# Patient Record
Sex: Female | Born: 2000 | Race: White | Hispanic: No | Marital: Single | Smoking: Never smoker
Health system: Southern US, Community
[De-identification: ages and names within clinical notes are randomized; demographics above are authoritative.]

## PROBLEM LIST (undated history)

## (undated) DIAGNOSIS — F32A Depression, unspecified: Secondary | ICD-10-CM

## (undated) DIAGNOSIS — F419 Anxiety disorder, unspecified: Secondary | ICD-10-CM

## (undated) DIAGNOSIS — E669 Obesity, unspecified: Secondary | ICD-10-CM

## (undated) DIAGNOSIS — J45909 Unspecified asthma, uncomplicated: Secondary | ICD-10-CM

## (undated) HISTORY — PX: WISDOM TOOTH EXTRACTION: SHX21

## (undated) HISTORY — PX: TONSILLECTOMY AND ADENOIDECTOMY: SUR1326

---
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
## 2021-01-08 ENCOUNTER — Ambulatory Visit: Payer: Self-pay | Admitting: Adult Health

## 2021-02-13 ENCOUNTER — Ambulatory Visit
Admission: EM | Admit: 2021-02-13 | Discharge: 2021-02-13 | Disposition: A | Discharge status: Home / Self Care | Payer: Self-pay | Attending: Family Medicine | Admitting: Family Medicine

## 2021-02-13 ENCOUNTER — Other Ambulatory Visit: Payer: Self-pay

## 2021-02-13 ENCOUNTER — Ambulatory Visit: Payer: Self-pay

## 2021-02-13 DIAGNOSIS — J069 Acute upper respiratory infection, unspecified: Secondary | ICD-10-CM

## 2021-02-13 DIAGNOSIS — K120 Recurrent oral aphthae: Secondary | ICD-10-CM

## 2021-02-13 MED ORDER — PREDNISONE 20 MG PO TABS: 40.0000 mg | ORAL_TABLET | Freq: Every day | ORAL | 0 refills | Status: DC

## 2021-02-13 NOTE — ED Triage Notes (Signed)
Patient presents to Urgent Care with complaints of sore throat and nasal congestion since Friday. She states she had a fever on sat/sun but since no fever. Treating symptoms with dayquil and nyquil.

## 2021-02-13 NOTE — Progress Notes (Signed)
Text sent to start video visit at approximately 1:15 pm.   Call placed to patient at approximately 1:20 pm, left message to return call or sign onto video visit to start visit.  At 1:30 pm, patient had not joined video visit; visit cancelled.

## 2021-02-14 ENCOUNTER — Telehealth (INDEPENDENT_AMBULATORY_CARE_PROVIDER_SITE_OTHER): Payer: Self-pay | Admitting: Nurse Practitioner

## 2021-02-14 DIAGNOSIS — Z91199 Patient's noncompliance with other medical treatment and regimen due to unspecified reason: Secondary | ICD-10-CM

## 2021-02-14 DIAGNOSIS — Z5329 Procedure and treatment not carried out because of patient's decision for other reasons: Secondary | ICD-10-CM

## 2021-02-16 NOTE — ED Provider Notes (Signed)
[facility]   [id number] 02/13/21 Arrival Time: 1351  ASSESSMENT & PLAN:  1. Viral URI with cough   2. Oral aphthous ulcer    Discussed typical duration of viral illnesses. OTC symptom care as needed.  Begin trial of: Meds ordered this encounter  Medications   predniSONE (DELTASONE) 20 MG tablet    Sig: Take 2 tablets (40 mg total) by mouth daily.    Dispense:  10 tablet    Refill:  0  Oragel if needed.   Follow-up Information     Valentino Nose, NP.   Specialty: Nurse Practitioner Why: If worsening or failing to improve as anticipated. Contact information: [address] [place] [address] [phone]                 Reviewed expectations re: course of current medical issues. Questions answered. Outlined signs and symptoms indicating need for more acute intervention. Understanding verbalized. After Visit Summary given.   SUBJECTIVE: History from: patient. Carolyn [name] is a 20 y.o. female who reports ST and nasal congestion; sev days; abrupt onset. Recent travel: none. Denies: fever and difficulty breathing. Normal PO intake without n/v/d.   OBJECTIVE:  Vitals:   02/13/21 1404 02/13/21 1406  BP: 128/89   Pulse: 84   Resp: 18   Temp:  98.3 F (36.8 C)  TempSrc: Oral Oral  SpO2: 96%     General appearance: alert; no distress Eyes: PERRLA; EOMI; conjunctiva normal HENT: [place]; AT; with nasal congestion; few small aphthous ulcers Neck: supple without LAD Lungs: speaks full sentences without difficulty; unlabored Extremities: no edema Skin: warm and dry Neurologic: normal gait Psychological: alert and cooperative; normal mood and affect  No Known Allergies  Past Medical History:  Diagnosis Date   Anxiety    Asthma    Depression    Obesity    Social History   Socioeconomic History   Marital status: Single    Spouse name: Not on file   Number of children: 0   Years of education: Not on file   Highest education  level: 10th grade  Occupational History   Not on file  Tobacco Use   Smoking status: Never   Smokeless tobacco: Never  Vaping Use   Vaping Use: Former  Substance and Sexual Activity   Alcohol use: Never   Drug use: Never   Sexual activity: Yes    Birth control/protection: None, Condom  Other Topics Concern   Not on file  Social History Narrative   Not on file   Social Determinants of Health   Financial Resource Strain: Not on file  Food Insecurity: Not on file  Transportation Needs: Not on file  Physical Activity: Not on file  Stress: Not on file  Social Connections: Not on file  Intimate Partner Violence: Not on file   Family History  Problem Relation Age of Onset   Cancer Paternal Grandfather    Diabetes Paternal Grandmother    Hypertension Paternal Grandmother    Hernia Paternal Grandmother    Other Paternal Grandmother        blood clots   Heart attack Paternal Grandmother    Stroke Paternal Grandmother    Hypertension Maternal Grandmother    Hyperthyroidism Maternal Grandmother    Cancer Maternal Grandmother    Anxiety disorder Maternal Grandmother    Diabetes Maternal Grandfather    Hypertension Maternal Grandfather    Congestive Heart Failure Maternal Grandfather    Stroke Maternal Grandfather  Congestive Heart Failure Father    Hypertension Father    Gout Father    Diabetes Father    Anxiety disorder Mother    Hypertension Sister    Gestational diabetes Sister    Anxiety disorder Maternal Aunt    Past Surgical History:  Procedure Laterality Date   CYSTOSCOPY WITH STENT PLACEMENT Left 04/02/2020   Procedure: CYSTOSCOPY WITH STENT PLACEMENT;  Surgeon: Jerilee Field, MD;  Location: [facility] OR;  Service: Urology;  Laterality: Left;   CYSTOSCOPY/URETEROSCOPY/HOLMIUM LASER/STENT PLACEMENT Left 04/17/2020   Procedure: CYSTOSCOPY/URETEROSCOPY/HOLMIUM LASER/STENT EXCHANGE;  Surgeon: Jerilee Field, MD;  Location: [facility] ORS;  Service: Urology;  Laterality:  Left;  ONLY NEEDS 60 MIN   TONSILLECTOMY AND ADENOIDECTOMY     WISDOM TOOTH EXTRACTION       Mardella Layman, MD 02/16/21 1047

## 2021-03-20 ENCOUNTER — Encounter (HOSPITAL_BASED_OUTPATIENT_CLINIC_OR_DEPARTMENT_OTHER): Payer: Self-pay | Admitting: Obstetrics and Gynecology

## 2021-03-20 ENCOUNTER — Emergency Department (HOSPITAL_BASED_OUTPATIENT_CLINIC_OR_DEPARTMENT_OTHER): Payer: Self-pay | Admitting: Radiology

## 2021-03-20 ENCOUNTER — Other Ambulatory Visit: Payer: Self-pay

## 2021-03-20 ENCOUNTER — Emergency Department (HOSPITAL_BASED_OUTPATIENT_CLINIC_OR_DEPARTMENT_OTHER)
Admission: EM | Admit: 2021-03-20 | Discharge: 2021-03-20 | Disposition: A | Discharge status: Home / Self Care | Payer: Self-pay | Attending: Emergency Medicine | Admitting: Emergency Medicine

## 2021-03-20 DIAGNOSIS — Y92009 Unspecified place in unspecified non-institutional (private) residence as the place of occurrence of the external cause: Secondary | ICD-10-CM | POA: Insufficient documentation

## 2021-03-20 DIAGNOSIS — X501XXA Overexertion from prolonged static or awkward postures, initial encounter: Secondary | ICD-10-CM | POA: Insufficient documentation

## 2021-03-20 DIAGNOSIS — S93401A Sprain of unspecified ligament of right ankle, initial encounter: Secondary | ICD-10-CM | POA: Insufficient documentation

## 2021-03-20 DIAGNOSIS — J4520 Mild intermittent asthma, uncomplicated: Secondary | ICD-10-CM | POA: Insufficient documentation

## 2021-03-20 NOTE — ED Provider Notes (Signed)
[facility] EMERGENCY DEPT Provider Note   CSN: [id number] Arrival date & time: 03/20/21  1200     History Chief Complaint  Patient presents with   Fall   Ankle Pain    Carolyn [name] is a 20 y.o. female.  20 year old female presents with complaint of right ankle pain.  Patient states that she rolled her ankle last night and then again today resulting in pain in her lateral ankle.  States that she may have sprained the ankle in the past however no significant injury otherwise.  Patient is able to bear weight with pain.  No other complaints or concerns.      Past Medical History:  Diagnosis Date   Anxiety    Asthma    Depression    Obesity     Patient Active Problem List   Diagnosis Date Noted   BMI 60.0-69.9, adult (HCC) 07/31/2020   Elevated blood-pressure reading without diagnosis of hypertension 07/31/2020   Recurrent major depressive disorder, in partial remission (HCC) 07/31/2020   Other specified anxiety disorders 07/31/2020   Left breast mass 05/22/2020   Missed period 05/22/2020   Encounter for initial prescription of injectable contraceptive 05/22/2020   Pyelonephritis of left kidney 04/02/2020   Left ureteral stone 04/02/2020   Microcytic anemia 04/02/2020   Sepsis secondary to UTI (HCC) 04/02/2020   Pregnancy examination or test, negative result 05/20/2019   Mild intermittent asthma without complication 02/15/2019   Seasonal allergies 02/15/2019   Encounter for initial prescription of contraceptive pills 12/29/2017   Superficial fungus infection of skin 12/29/2017   Class 3 severe obesity due to excess calories without serious comorbidity with body mass index (BMI) of 60.0 to 69.9 in adult (HCC) 12/29/2017   Irregular periods 12/29/2017   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   Quadriceps weakness 06/27/2015    Past Surgical  History:  Procedure Laterality Date   CYSTOSCOPY WITH STENT PLACEMENT Left 04/02/2020   Procedure: CYSTOSCOPY WITH STENT PLACEMENT;  Surgeon: Jerilee Field, MD;  Location: [facility] OR;  Service: Urology;  Laterality: Left;   CYSTOSCOPY/URETEROSCOPY/HOLMIUM LASER/STENT PLACEMENT Left 04/17/2020   Procedure: CYSTOSCOPY/URETEROSCOPY/HOLMIUM LASER/STENT EXCHANGE;  Surgeon: Jerilee Field, MD;  Location: [facility] ORS;  Service: Urology;  Laterality: Left;  ONLY NEEDS 60 MIN   TONSILLECTOMY AND ADENOIDECTOMY     WISDOM TOOTH EXTRACTION       OB History     Gravida  0   Para  0   Term  0   Preterm  0   AB  0   Living  0      SAB  0   IAB  0   Ectopic  0   Multiple  0   Live Births  0           Family History  Problem Relation Age of Onset   Cancer Paternal Grandfather    Diabetes Paternal Grandmother    Hypertension Paternal Grandmother    Hernia Paternal Grandmother    Other Paternal Grandmother        blood clots   Heart attack Paternal Grandmother    Stroke Paternal Grandmother    Hypertension Maternal Grandmother    Hyperthyroidism Maternal Grandmother    Cancer Maternal Grandmother    Anxiety disorder Maternal Grandmother    Diabetes Maternal Grandfather    Hypertension Maternal Grandfather    Congestive Heart Failure Maternal Grandfather  Stroke Maternal Grandfather    Congestive Heart Failure Father    Hypertension Father    Gout Father    Diabetes Father    Anxiety disorder Mother    Hypertension Sister    Gestational diabetes Sister    Anxiety disorder Maternal Aunt     Social History   Tobacco Use   Smoking status: Never   Smokeless tobacco: Never  Vaping Use   Vaping Use: Former  Substance Use Topics   Alcohol use: Never   Drug use: Never    Home Medications Prior to Admission medications   Medication Sig Start Date End Date Taking? Authorizing Provider  escitalopram (LEXAPRO) 20 MG tablet Take 1 tablet (20 mg total) by mouth daily.  08/08/20   Darcel Smalling, MD  hydrOXYzine (ATARAX/VISTARIL) 10 MG tablet Take 10 mg by mouth at bedtime as needed.    [provider]  ibuprofen (ADVIL) 400 MG tablet Take 1 tablet (400 mg total) by mouth every 6 (six) hours as needed for fever or mild pain (Use if tylenol ineffective). 04/04/20   Cipriano Bunker, MD  nystatin (MYCOSTATIN/NYSTOP) powder Apply 1 application topically 3 (three) times daily.    [provider]  predniSONE (DELTASONE) 20 MG tablet Take 2 tablets (40 mg total) by mouth daily. Patient not taking: Reported on 03/20/2021 02/13/21   Mardella Layman, MD    Allergies    Patient has no known allergies.  Review of Systems   Review of Systems  Constitutional:  Negative for fever.  Musculoskeletal:  Positive for arthralgias, gait problem and joint swelling.  Skin:  Negative for color change, rash and wound.  Allergic/Immunologic: Negative for immunocompromised state.  Neurological:  Negative for weakness and numbness.  Hematological:  Does not bruise/bleed easily.  Psychiatric/Behavioral:  Negative for confusion.   All other systems reviewed and are negative.  Physical Exam Updated Vital Signs BP (!) 125/95 (BP Location: Right Arm)   Pulse 83   Temp 98.1 F (36.7 C)   Resp 16   Ht 5\' 3"  (1.6 m)   Wt (!) 158.8 kg   LMP 03/11/2021 (Exact Date)   SpO2 100%   BMI 62.00 kg/m   Physical Exam Vitals and nursing note reviewed.  Constitutional:      General: She is not in acute distress.    Appearance: She is well-developed. She is not diaphoretic.  HENT:     Head: Normocephalic and atraumatic.  Cardiovascular:     Pulses: Normal pulses.  Pulmonary:     Effort: Pulmonary effort is normal.  Musculoskeletal:        General: Swelling, tenderness and signs of injury present. No deformity.     Right ankle: Swelling present. No ecchymosis or lacerations. Tenderness present over the lateral malleolus. No base of 5th metatarsal or proximal fibula  tenderness. Normal range of motion. Normal pulse.     Left ankle: Normal.  Skin:    General: Skin is warm and dry.     Findings: No erythema or rash.  Neurological:     Mental Status: She is alert and oriented to person, place, and time.     Sensory: No sensory deficit.     Motor: No weakness.  Psychiatric:        Behavior: Behavior normal.    ED Results / Procedures / Treatments   Labs (all labs ordered are listed, but only abnormal results are displayed) Labs Reviewed - No data to display  EKG None  Radiology DG  Ankle Complete Right  Result Date: 03/20/2021 CLINICAL DATA:  Right ankle pain and swelling after injury 1 day ago EXAM: RIGHT ANKLE - COMPLETE 3+ VIEW COMPARISON:  01/24/2016 FINDINGS: There is no evidence of fracture, dislocation, or joint effusion. There is no evidence of arthropathy or other focal bone abnormality. Os trigonum versus prominent stieda process. Soft tissue swelling most pronounced at the lateral ankle. IMPRESSION: 1. No acute fracture or dislocation. 2. Soft tissue swelling most pronounced at the lateral ankle. Electronically Signed   By: Duanne Guess D.O.   On: 03/20/2021 13:02    Procedures Procedures   Medications Ordered in ED Medications - No data to display  ED Course  I have reviewed the triage vital signs and the nursing notes.  Pertinent labs & imaging results that were available during my care of the patient were reviewed by me and considered in my medical decision making (see chart for details).  Clinical Course as of 03/20/21 1351  Wed Mar 20, 2021  6958 20 year old female with right ankle injury as above.  X-ray negative for fracture.  Found to have tenderness to lateral malleolus and anterior to the lateral malleolus.  No pain at the proximal fibula or fifth metatarsal.  Pulse intact, sensation intact.  Plan is to treat as sprain with ankle ASO.  Offered crutches to weight-bear as tolerated recommend recheck with PCP in 1 week if  not improving. [LM]    Clinical Course User Index [LM] Alden Hipp   MDM Rules/Calculators/A&P                            Final Clinical Impression(s) / ED Diagnoses Final diagnoses:  Sprain of right ankle, unspecified ligament, initial encounter    Rx / DC Orders ED Discharge Orders     None        Jeannie Fend, PA-C 03/20/21 1351    Alvira Monday, MD 03/23/21 [phone]

## 2021-03-20 NOTE — ED Notes (Signed)
Pt stated that she has crutches at home. She refused the crutches.

## 2021-03-20 NOTE — Discharge Instructions (Addendum)
Motrin and Tylenol as needed as directed for pain.  Apply ice to your ankle and elevate for 20 minutes at a time.  Use crutches, weight-bear as tolerated. Recheck with your primary care provider if not improving in 1 week.

## 2021-03-20 NOTE — ED Triage Notes (Signed)
Patient reports a fall and right ankle pain. Patient endorses that she was at a haunted house last night and rolled it then hurt it again this morning stepping off her porch. Denies hitting head or LOC

## 2021-03-26 ENCOUNTER — Other Ambulatory Visit: Payer: Self-pay

## 2021-03-26 ENCOUNTER — Encounter: Payer: Self-pay | Admitting: Adult Health

## 2021-03-26 ENCOUNTER — Ambulatory Visit: Payer: Self-pay | Admitting: Adult Health

## 2021-03-29 ENCOUNTER — Ambulatory Visit (INDEPENDENT_AMBULATORY_CARE_PROVIDER_SITE_OTHER): Payer: Self-pay | Admitting: Adult Health

## 2021-03-29 ENCOUNTER — Encounter: Payer: Self-pay | Admitting: Adult Health

## 2021-03-29 ENCOUNTER — Other Ambulatory Visit (HOSPITAL_COMMUNITY)
Admission: RE | Admit: 2021-03-29 | Discharge: 2021-03-29 | Disposition: A | Discharge status: Home / Self Care | Payer: Self-pay | Source: Ambulatory Visit | Attending: Adult Health | Admitting: Adult Health

## 2021-03-29 ENCOUNTER — Other Ambulatory Visit: Payer: Self-pay

## 2021-03-29 VITALS — BP 128/83 | HR 82 | Ht 62.0 in | Wt 368.0 lb

## 2021-03-29 DIAGNOSIS — R5383 Other fatigue: Secondary | ICD-10-CM

## 2021-03-29 DIAGNOSIS — Z113 Encounter for screening for infections with a predominantly sexual mode of transmission: Secondary | ICD-10-CM | POA: Insufficient documentation

## 2021-03-29 DIAGNOSIS — N926 Irregular menstruation, unspecified: Secondary | ICD-10-CM

## 2021-03-29 DIAGNOSIS — N946 Dysmenorrhea, unspecified: Secondary | ICD-10-CM

## 2021-03-29 DIAGNOSIS — N921 Excessive and frequent menstruation with irregular cycle: Secondary | ICD-10-CM

## 2021-03-29 MED ORDER — LO LOESTRIN FE 1 MG-10 MCG / 10 MCG PO TABS: 1.0000 | ORAL_TABLET | Freq: Every day | ORAL | 0 refills | Status: DC

## 2021-03-29 NOTE — Progress Notes (Signed)
  Subjective:     Patient ID: Carolyn [name], female   DOB: 05/16/2001, 20 y.o.   MRN: [id number]  HPI Carolyn [name] is a 20 year old white female,single, G0P0, in complaining of irregular periods and heavy periods, may last 3-14 days and heavy about 5, will change pads every 1-2 hours and has bad pain with periods, and pain with sex and even BMs. And feels tired, wonders if endometriosis or PCO, has CT in March without mention of cysts on ovaries. PCP is Cathlean Marseilles NP.   Review of Systems +irregular periods +heavy periods +painful periods +pain with sex and BM +tired  Reviewed past medical,surgical, social and family history. Reviewed medications and allergies.     Objective:   Physical Exam BP 128/83 (BP Location: Left Arm, Patient Position: Sitting, Cuff Size: Large)   Pulse 82   Ht 5\' 2"  (1.575 m)   Wt (!) 368 lb (166.9 kg)   LMP 03/11/2021 (Exact Date)   BMI 67.31 kg/m  Skin warm and dry.Pelvic: external genitalia is normal in appearance no lesions, vagina: scant discharge with odor,urethra has no lesions or masses noted, cervix:smooth, uterus: normal size, shape and contour, non tender, no masses felt, adnexa: no masses + tenderness noted,R>L.  Bladder is non tender and no masses felt. CV swab obtained.   Examination chaperoned by 05/11/2021 Fall risk is low  Upstream - 03/29/21 1239       Pregnancy Intention Screening   Does the patient want to become pregnant in the next year? Ok Either Way    Does the patient's partner want to become pregnant in the next year? Ok Either Way    Would the patient like to discuss contraceptive options today? No      Contraception Wrap Up   Current Method Female Condom    End Method Female Condom    Contraception Counseling Provided No             Assessment:       1. Irregular periods Will try lo loestrin, to start today, 3 packs given and use condoms   2. Menorrhagia with irregular cycle   3. Dysmenorrhea   4. Other  fatigue   5. Screening examination for STD (sexually transmitted disease) CV swab with GC/CHL,trich and BV and yeast   6. Morbid obesity (HCC) Try to lose some weight Plan:     Follow up in 8 weeks, if not better will get labs and possible 03/31/21

## 2021-04-02 LAB — CERVICOVAGINAL ANCILLARY ONLY
Bacterial Vaginitis (gardnerella): NEGATIVE
Candida Glabrata: NEGATIVE
Candida Vaginitis: NEGATIVE
Chlamydia: NEGATIVE
Comment: NEGATIVE
Comment: NEGATIVE
Comment: NEGATIVE
Comment: NEGATIVE
Comment: NEGATIVE
Comment: NORMAL
Neisseria Gonorrhea: NEGATIVE
Trichomonas: NEGATIVE

## 2021-04-09 ENCOUNTER — Encounter (HOSPITAL_BASED_OUTPATIENT_CLINIC_OR_DEPARTMENT_OTHER): Payer: Self-pay | Admitting: *Deleted

## 2021-04-09 ENCOUNTER — Other Ambulatory Visit: Payer: Self-pay

## 2021-04-09 ENCOUNTER — Other Ambulatory Visit: Payer: Self-pay | Admitting: Adult Health

## 2021-04-09 DIAGNOSIS — N939 Abnormal uterine and vaginal bleeding, unspecified: Secondary | ICD-10-CM | POA: Insufficient documentation

## 2021-04-09 DIAGNOSIS — J4520 Mild intermittent asthma, uncomplicated: Secondary | ICD-10-CM | POA: Insufficient documentation

## 2021-04-09 LAB — CBC
HCT: 35.5 % — ABNORMAL LOW (ref 36.0–46.0)
Hemoglobin: 10.4 g/dL — ABNORMAL LOW (ref 12.0–15.0)
MCH: 19.8 pg — ABNORMAL LOW (ref 26.0–34.0)
MCHC: 29.3 g/dL — ABNORMAL LOW (ref 30.0–36.0)
MCV: 67.7 fL — ABNORMAL LOW (ref 80.0–100.0)
Platelets: 501 10*3/uL — ABNORMAL HIGH (ref 150–400)
RBC: 5.24 MIL/uL — ABNORMAL HIGH (ref 3.87–5.11)
RDW: 18.2 % — ABNORMAL HIGH (ref 11.5–15.5)
WBC: 14.1 10*3/uL — ABNORMAL HIGH (ref 4.0–10.5)
nRBC: 0 % (ref 0.0–0.2)

## 2021-04-09 LAB — PREGNANCY, URINE: Preg Test, Ur: NEGATIVE

## 2021-04-09 NOTE — ED Triage Notes (Signed)
Reports heavy vaginal bleeding since Saturday night "woke up in a pool of blood". States she has been going through 2 pads + ultra tampon every hour. Spoke to her gyn and was advised to seen seen in ED. She started on new birth control pill ~1 week ago

## 2021-04-10 ENCOUNTER — Emergency Department (HOSPITAL_BASED_OUTPATIENT_CLINIC_OR_DEPARTMENT_OTHER)
Admission: EM | Admit: 2021-04-10 | Discharge: 2021-04-10 | Disposition: A | Discharge status: Home / Self Care | Payer: Self-pay | Attending: Emergency Medicine | Admitting: Emergency Medicine

## 2021-04-10 DIAGNOSIS — N939 Abnormal uterine and vaginal bleeding, unspecified: Secondary | ICD-10-CM

## 2021-04-10 LAB — URINALYSIS, ROUTINE W REFLEX MICROSCOPIC
Bilirubin Urine: NEGATIVE
Glucose, UA: NEGATIVE mg/dL
Ketones, ur: NEGATIVE mg/dL
Nitrite: NEGATIVE
Protein, ur: 30 mg/dL — AB
RBC / HPF: >50 RBC/hpf — ABNORMAL HIGH (ref 0–5)
Specific Gravity, Urine: 1.028 (ref 1.005–1.030)
pH: 6 (ref 5.0–8.0)

## 2021-04-10 LAB — HEMOGLOBIN AND HEMATOCRIT, BLOOD
HCT: 34.1 % — ABNORMAL LOW (ref 36.0–46.0)
Hemoglobin: 9.9 g/dL — ABNORMAL LOW (ref 12.0–15.0)

## 2021-04-10 MED ORDER — MEDROXYPROGESTERONE ACETATE 5 MG PO TABS
10.0000 mg | ORAL_TABLET | Freq: Every day | ORAL | 0 refills | Status: DC
Start: 2021-04-10 — End: 2021-04-10

## 2021-04-10 MED ORDER — MEDROXYPROGESTERONE ACETATE 5 MG PO TABS: 10.0000 mg | ORAL_TABLET | Freq: Every day | ORAL | 0 refills | Status: DC

## 2021-04-10 NOTE — ED Provider Notes (Signed)
[facility]ENC[facility] DEPT Provider Note   CSN: [id number] Arrival date & time: 04/09/21  2046     History Chief Complaint  Patient presents with   Vaginal Bleeding    Carolyn [name] is a 20 y.o. female.  Patient is a 20 year old female with history of asthma, obesity, and suspected polycystic ovaries.  Patient presenting today for evaluation of vaginal bleeding.  She reports being started on a new oral contraceptive 2 weeks ago.  She started 3 evenings ago with vaginal bleeding.  She states that she has soiled a pad per hour since.  She attempted to call the GYN and received a message to come to the ER to be evaluated.  She denies to me she is having any abdominal pain, but does report some cramping when she passes clots.  She denies any fevers or chills.  She reports having had pelvic examination 2 weeks ago at the GYN office.  STD testing was negative and was otherwise unremarkable.  The history is provided by the patient.  Vaginal Bleeding Quality:  Bright red Severity:  Moderate Onset quality:  Gradual Duration:  3 days Timing:  Constant Progression:  Unchanged Chronicity:  New     Past Medical History:  Diagnosis Date   Anxiety    Asthma    Depression    Obesity     Patient Active Problem List   Diagnosis Date Noted   Other fatigue 03/29/2021   Dysmenorrhea 03/29/2021   Menorrhagia with irregular cycle 03/29/2021   Screening examination for STD (sexually transmitted disease) 03/29/2021   Morbid obesity ([place]) 03/29/2021   BMI 60.0-69.9, adult ([place]) 07/31/2020   Elevated blood-pressure reading without diagnosis of hypertension 07/31/2020   Recurrent major depressive disorder, in partial remission ([place]) 07/31/2020   Other specified anxiety disorders 07/31/2020   Left breast mass 05/22/2020   Missed period 05/22/2020   Encounter for initial prescription of injectable contraceptive 05/22/2020   Pyelonephritis of left kidney 04/02/2020   Left  ureteral stone 04/02/2020   Microcytic anemia 04/02/2020   Sepsis secondary to UTI ([place]) 04/02/2020   Pregnancy examination or test, negative result 05/20/2019   Mild intermittent asthma without complication [id number]   Seasonal allergies 02/15/2019   Encounter for initial prescription of contraceptive pills 12/29/2017   Superficial fungus infection of skin 12/29/2017   Class 3 severe obesity due to excess calories without serious comorbidity with body mass index (BMI) of 60.0 to 69.9 in adult ([place]) 12/29/2017   Irregular periods 12/29/2017   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   Quadriceps weakness 06/27/2015    Past Surgical History:  Procedure Laterality Date   CYSTOSCOPY WITH STENT PLACEMENT Left 04/02/2020   Procedure: CYSTOSCOPY WITH STENT PLACEMENT;  Surgeon: Festus Aloe, MD;  Location: [facility];  Service: Urology;  Laterality: Left;   CYSTOSCOPY/URETEROSCOPY/HOLMIUM LASER/STENT PLACEMENT Left 04/17/2020   Procedure: CYSTOSCOPY/URETEROSCOPY/HOLMIUM LASER/STENT EXCHANGE;  Surgeon: Festus Aloe, MD;  Location: [facility];  Service: Urology;  Laterality: Left;  ONLY NEEDS 60 MIN   TONSILLECTOMY AND ADENOIDECTOMY     WISDOM TOOTH EXTRACTION       OB History     Gravida  0   Para  0   Term  0   Preterm  0   AB  0   Living  0      SAB  0   IAB  0   Ectopic  0   Multiple  0   Live Births  0           Family History  Problem Relation Age of Onset   Cancer Paternal Grandfather    Diabetes Paternal Grandmother    Hypertension Paternal Grandmother    Hernia Paternal Grandmother    Other Paternal Grandmother        blood clots   Heart attack Paternal Grandmother    Stroke Paternal Grandmother    Hypertension Maternal Grandmother    Hyperthyroidism Maternal Grandmother    Cancer Maternal Grandmother    Anxiety disorder Maternal Grandmother     Diabetes Maternal Grandfather    Hypertension Maternal Grandfather    Congestive Heart Failure Maternal Grandfather    Stroke Maternal Grandfather    Congestive Heart Failure Father    Hypertension Father    Gout Father    Diabetes Father    Anxiety disorder Mother    Hypertension Sister    Gestational diabetes Sister    Anxiety disorder Maternal Aunt     Social History   Tobacco Use   Smoking status: Never   Smokeless tobacco: Never  Vaping Use   Vaping Use: Some days  Substance Use Topics   Alcohol use: Never   Drug use: Never    Home Medications Prior to Admission medications   Medication Sig Start Date End Date Taking? Authorizing Provider  hydrOXYzine (ATARAX/VISTARIL) 10 MG tablet Take 10 mg by mouth at bedtime as needed.    [provider]  ibuprofen (ADVIL) 400 MG tablet Take 1 tablet (400 mg total) by mouth every 6 (six) hours as needed for fever or mild pain (Use if tylenol ineffective). 04/04/20   Shawna Clamp, MD  Norethindrone-Ethinyl Estradiol-Fe Biphas (LO LOESTRIN FE) 1 MG-10 MCG / 10 MCG tablet Take 1 tablet by mouth daily. Take 1 daily by mouth 03/29/21   Derrek Monaco A, NP  nystatin (MYCOSTATIN/NYSTOP) powder Apply 1 application topically 3 (three) times daily.    [provider]    Allergies    Patient has no known allergies.  Review of Systems   Review of Systems  Genitourinary:  Positive for vaginal bleeding.  All other systems reviewed and are negative.  Physical Exam Updated Vital Signs BP (!) 122/109 (BP Location: Right Wrist)   Pulse 93   Temp 98.3 F (36.8 C)   Resp 18   Ht 5\' 2"  (1.575 m)   Wt (!) 166.9 kg   LMP 04/06/2021 (Exact Date)   SpO2 100%   BMI 67.30 kg/m   Physical Exam Vitals and nursing note reviewed.  Constitutional:      General: She is not in acute distress.    Appearance: She is well-developed. She is not diaphoretic.  HENT:     Head: Normocephalic and atraumatic.  Cardiovascular:      Rate and Rhythm: Normal rate and regular rhythm.     Heart sounds: No murmur heard.   No friction rub. No gallop.  Pulmonary:     Effort: Pulmonary effort is normal. No respiratory distress.     Breath sounds: Normal breath sounds. No wheezing.  Abdominal:     General: Bowel sounds are normal. There is no distension.     Palpations: Abdomen is soft.     Tenderness: There is no abdominal tenderness.  Musculoskeletal:        General: Normal range of motion.     Cervical back: Normal range of motion and neck supple.  Skin:    General: Skin is warm and dry.  Neurological:     General: No focal deficit present.     Mental Status: She is alert and oriented to person, place, and time.    ED Results / Procedures / Treatments   Labs (all labs ordered are listed, but only abnormal results are displayed) Labs Reviewed  CBC - Abnormal; Notable for the following components:      Result Value   WBC 14.1 (*)    RBC 5.24 (*)    Hemoglobin 10.4 (*)    HCT 35.5 (*)    MCV 67.7 (*)    MCH 19.8 (*)    MCHC 29.3 (*)    RDW 18.2 (*)    Platelets 501 (*)    All other components within normal limits  PREGNANCY, URINE  URINALYSIS, ROUTINE W REFLEX MICROSCOPIC  HEMOGLOBIN AND HEMATOCRIT, BLOOD    EKG None  Radiology No results found.  Procedures Procedures   Medications Ordered in ED Medications - No data to display  ED Course  I have reviewed the triage vital signs and the nursing notes.  Pertinent labs & imaging results that were available during my care of the patient were reviewed by me and considered in my medical decision making (see chart for details).    MDM Rules/Calculators/A&P  Patient presenting here with complaints of vaginal bleeding as described in the HPI.  Patient has been here for now 7 hours.  Initial hemoglobin was 10.4, then repeated at 9.9.  She appears hemodynamically stable.  Patient recently started on a new birth control pill that I suspect is the  reason for this.  I will have her hold this medication until she speaks with her GYN.  I will also prescribe Provera if her bleeding does not improve in the next 12 hours.  Patient not in need of transfusion and I believe can be discharged safely.  Final Clinical Impression(s) / ED Diagnoses Final diagnoses:  None    Rx / DC Orders ED Discharge Orders     None        Geoffery Lyons, MD 04/10/21 [phone]

## 2021-04-10 NOTE — Discharge Instructions (Addendum)
Stop taking Loestrin as previously prescribed.  If bleeding does not subside in the next 12 hours, fill the prescription for Provera you have been given this evening.  Follow-up with your GYN tomorrow, and return to the ER if you develop severe abdominal pain, worsening bleeding, dizziness/lightheadedness, or other new and concerning symptoms.

## 2021-05-24 ENCOUNTER — Ambulatory Visit: Payer: Self-pay | Admitting: Adult Health

## 2021-06-05 ENCOUNTER — Ambulatory Visit
Admission: EM | Admit: 2021-06-05 | Discharge: 2021-06-05 | Disposition: A | Discharge status: Home / Self Care | Payer: Self-pay | Attending: Urgent Care | Admitting: Urgent Care

## 2021-06-05 ENCOUNTER — Ambulatory Visit (HOSPITAL_COMMUNITY): Admit: 2021-06-05 | Payer: Self-pay

## 2021-06-05 ENCOUNTER — Other Ambulatory Visit: Payer: Self-pay

## 2021-06-05 DIAGNOSIS — R0602 Shortness of breath: Secondary | ICD-10-CM

## 2021-06-05 DIAGNOSIS — J4520 Mild intermittent asthma, uncomplicated: Secondary | ICD-10-CM

## 2021-06-05 DIAGNOSIS — R070 Pain in throat: Secondary | ICD-10-CM

## 2021-06-05 DIAGNOSIS — J069 Acute upper respiratory infection, unspecified: Secondary | ICD-10-CM

## 2021-06-05 DIAGNOSIS — R052 Subacute cough: Secondary | ICD-10-CM

## 2021-06-05 LAB — POCT RAPID STREP A (OFFICE): Rapid Strep A Screen: NEGATIVE

## 2021-06-05 MED ORDER — PROMETHAZINE-DM 6.25-15 MG/5ML PO SYRP: 5.0000 mL | ORAL_SOLUTION | Freq: Every evening | ORAL | 0 refills | Status: DC | PRN

## 2021-06-05 MED ORDER — ALBUTEROL SULFATE HFA 108 (90 BASE) MCG/ACT IN AERS: 1.0000 | INHALATION_SPRAY | Freq: Four times a day (QID) | RESPIRATORY_TRACT | 0 refills | Status: AC | PRN

## 2021-06-05 MED ORDER — PSEUDOEPHEDRINE HCL 60 MG PO TABS: 60.0000 mg | ORAL_TABLET | Freq: Three times a day (TID) | ORAL | 0 refills | Status: DC | PRN

## 2021-06-05 MED ORDER — CETIRIZINE HCL 10 MG PO TABS: 10.0000 mg | ORAL_TABLET | Freq: Every day | ORAL | 0 refills | Status: DC

## 2021-06-05 MED ORDER — BENZONATATE 100 MG PO CAPS: 100.0000 mg | ORAL_CAPSULE | Freq: Three times a day (TID) | ORAL | 0 refills | Status: DC | PRN

## 2021-06-05 NOTE — ED Provider Notes (Signed)
[facility]   MRN: [id number] DOB: [birth year]  Subjective:   Carolyn [name] is a 20 y.o. female presenting for 2 day history of acute onset throat pain, painful swallowing, cough, shortness of breath. Has had runny and stuffy nose. No sick contacts. Had 2 COVID tests at work and were negative.  Does not want a repeat test.  Has a history of strep and would like to be checked for this.  Also has a history of asthma.  No current facility-administered medications for this encounter.  Current Outpatient Medications:    hydrOXYzine (ATARAX/VISTARIL) 10 MG tablet, Take 10 mg by mouth at bedtime as needed., Disp: , Rfl:    ibuprofen (ADVIL) 400 MG tablet, Take 1 tablet (400 mg total) by mouth every 6 (six) hours as needed for fever or mild pain (Use if tylenol ineffective)., Disp: 30 tablet, Rfl: 0   medroxyPROGESTERone (PROVERA) 5 MG tablet, Take 2 tablets (10 mg total) by mouth daily., Disp: 15 tablet, Rfl: 0   Norethindrone-Ethinyl Estradiol-Fe Biphas (LO LOESTRIN FE) 1 MG-10 MCG / 10 MCG tablet, Take 1 tablet by mouth daily. Take 1 daily by mouth, Disp: 84 tablet, Rfl: 0   nystatin (MYCOSTATIN/NYSTOP) powder, Apply 1 application topically 3 (three) times daily., Disp: , Rfl:    No Known Allergies  Past Medical History:  Diagnosis Date   Anxiety    Asthma    Depression    Obesity      Past Surgical History:  Procedure Laterality Date   CYSTOSCOPY WITH STENT PLACEMENT Left 04/02/2020   Procedure: CYSTOSCOPY WITH STENT PLACEMENT;  Surgeon: Jerilee Field, MD;  Location: [facility];  Service: Urology;  Laterality: Left;   CYSTOSCOPY/URETEROSCOPY/HOLMIUM LASER/STENT PLACEMENT Left 04/17/2020   Procedure: CYSTOSCOPY/URETEROSCOPY/HOLMIUM LASER/STENT EXCHANGE;  Surgeon: Jerilee Field, MD;  Location: [facility];  Service: Urology;  Laterality: Left;  ONLY NEEDS 60 MIN   TONSILLECTOMY AND ADENOIDECTOMY     WISDOM TOOTH EXTRACTION      Family History  Problem Relation Age of  Onset   Cancer Paternal Grandfather    Diabetes Paternal Grandmother    Hypertension Paternal Grandmother    Hernia Paternal Grandmother    Other Paternal Grandmother        blood clots   Heart attack Paternal Grandmother    Stroke Paternal Grandmother    Hypertension Maternal Grandmother    Hyperthyroidism Maternal Grandmother    Cancer Maternal Grandmother    Anxiety disorder Maternal Grandmother    Diabetes Maternal Grandfather    Hypertension Maternal Grandfather    Congestive Heart Failure Maternal Grandfather    Stroke Maternal Grandfather    Congestive Heart Failure Father    Hypertension Father    Gout Father    Diabetes Father    Anxiety disorder Mother    Hypertension Sister    Gestational diabetes Sister    Anxiety disorder Maternal Aunt     Social History   Tobacco Use   Smoking status: Never   Smokeless tobacco: Never  Vaping Use   Vaping Use: Some days  Substance Use Topics   Alcohol use: Never   Drug use: Never    ROS   Objective:   Vitals: BP 134/81    Pulse 85    Temp 98.8 F (37.1 C)    Resp 20    LMP 05/02/2021 (Approximate)    SpO2 97%   Physical Exam Constitutional:      General: She is not in acute distress.    Appearance:  Normal appearance. She is well-developed. She is obese. She is not ill-appearing, toxic-appearing or diaphoretic.  HENT:     Head: Normocephalic and atraumatic.     Right Ear: Tympanic membrane and ear canal normal. No drainage or tenderness. No middle ear effusion. Tympanic membrane is not erythematous.     Left Ear: Tympanic membrane and ear canal normal. No drainage or tenderness.  No middle ear effusion. Tympanic membrane is not erythematous.     Nose: Nose normal. No congestion or rhinorrhea.     Mouth/Throat:     Mouth: Mucous membranes are moist. No oral lesions.     Pharynx: No pharyngeal swelling, oropharyngeal exudate, posterior oropharyngeal erythema or uvula swelling.     Tonsils: No tonsillar exudate or  tonsillar abscesses.  Eyes:     Extraocular Movements: Extraocular movements intact.     Right eye: Normal extraocular motion.     Left eye: Normal extraocular motion.     Conjunctiva/sclera: Conjunctivae normal.     Pupils: Pupils are equal, round, and reactive to light.  Cardiovascular:     Rate and Rhythm: Normal rate and regular rhythm.     Pulses: Normal pulses.     Heart sounds: Normal heart sounds. No murmur heard.   No friction rub. No gallop.  Pulmonary:     Effort: Pulmonary effort is normal. No respiratory distress.     Breath sounds: Normal breath sounds. No stridor. No wheezing, rhonchi or rales.  Musculoskeletal:     Cervical back: Normal range of motion and neck supple.  Lymphadenopathy:     Cervical: No cervical adenopathy.  Skin:    General: Skin is warm and dry.     Findings: No rash.  Neurological:     General: No focal deficit present.     Mental Status: She is alert and oriented to person, place, and time.  Psychiatric:        Mood and Affect: Mood normal.        Behavior: Behavior normal.        Thought Content: Thought content normal.    Results for orders placed or performed during the hospital encounter of 06/05/21 (from the past 24 hour(s))  POCT rapid strep A     Status: None   Collection Time: 06/05/21  1:54 PM  Result Value Ref Range   Rapid Strep A Screen Negative Negative    Assessment and Plan :   PDMP not reviewed this encounter.  1. Viral upper respiratory illness   2. Throat pain   3. Subacute cough   4. Shortness of breath   5. Mild intermittent asthma without complication    Deferred imaging given clear cardiopulmonary exam, hemodynamically stable vital signs.  Strep culture pending.  Recommended managing for viral upper respiratory illness with supportive care.  I refilled her albuterol inhaler.  Patient declined flu and COVID testing. Counseled patient on potential for adverse effects with medications prescribed/recommended today,  ER and return-to-clinic precautions discussed, patient verbalized understanding.    Wallis Bamberg, PA-C 06/05/21 1423

## 2021-06-05 NOTE — ED Triage Notes (Signed)
Pt presents with c/o sore throat that began 2 days ago , has h/o strep per pt

## 2021-06-05 NOTE — Discharge Instructions (Signed)

## 2021-06-08 LAB — CULTURE, GROUP A STREP (THRC)

## 2022-02-20 IMAGING — CT CT ABD-PELV W/ CM
2 of 4 series · 15 of 46 positions shown, 17 images · IV contrast (omnipaque)
Comparison: Chest radiograph earlier today. Abdominopelvic CT
04/01/2020

CLINICAL DATA: PE suspected, high prob follow up from xray; RLQ
abdominal pain Fever, tachycardia, RLQ pain

Right-sided abdominal pain with nausea.
EXAM:
CT ANGIOGRAPHY CHEST
CT ABDOMEN AND PELVIS WITH CONTRAST
TECHNIQUE: Multidetector CT imaging of the chest was performed using the
standard protocol during bolus administration of intravenous
contrast. Multiplanar CT image reconstructions and MIPs were
obtained to evaluate the vascular anatomy. Multidetector CT imaging
of the abdomen and pelvis was performed using the standard protocol
during bolus administration of intravenous contrast.
CONTRAST:  100mL OMNIPAQUE IOHEXOL 350 MG/ML SOLN

[Series 13: a/p w/ 5mm · axial · 0.98mm/px · z∈[+841,+1301]mm · 12 of 106 slices shown, 14 images]
[im 9/106  soft-tissue]
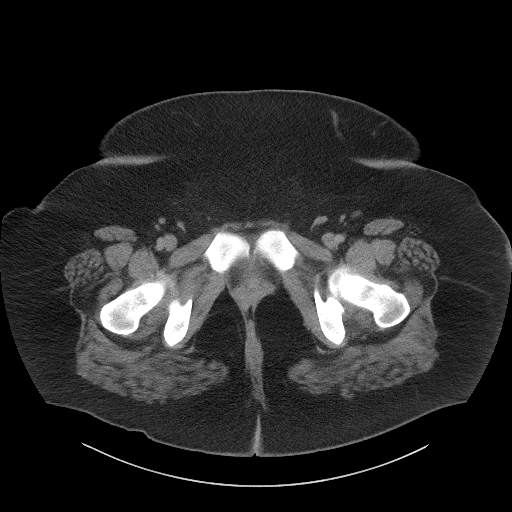
[im 9/106  bone]
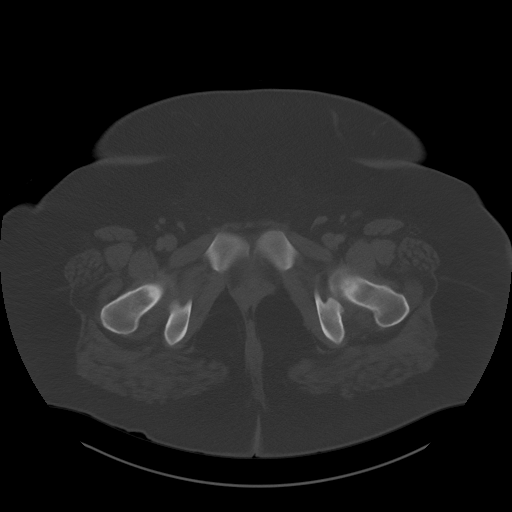
[im 17/106  soft-tissue]
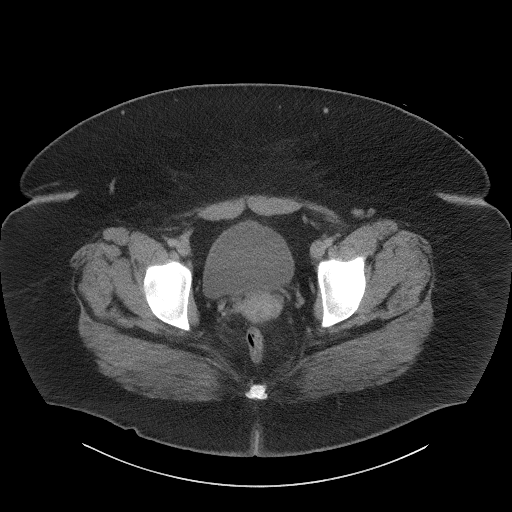
[im 26/106  soft-tissue]
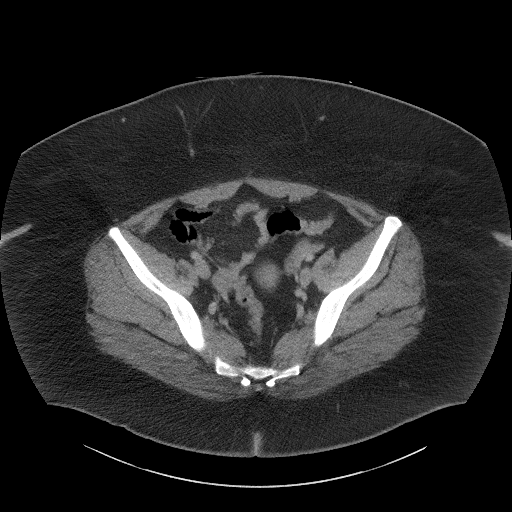
[im 34/106  soft-tissue]
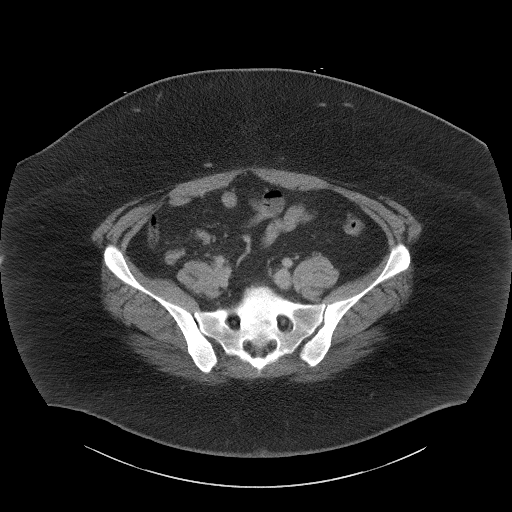
[im 43/106  soft-tissue]
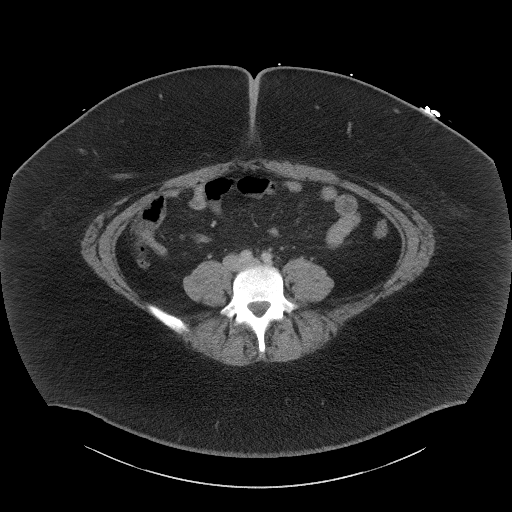
[im 51/106  soft-tissue]
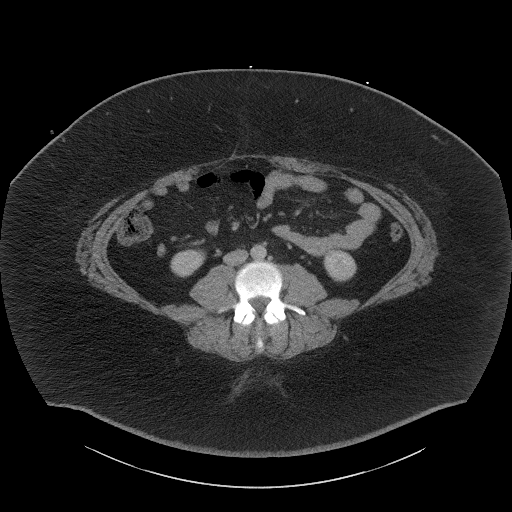
[im 59/106  soft-tissue]
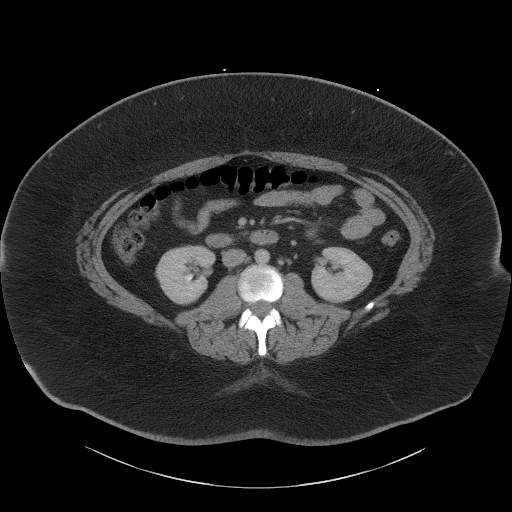
[im 68/106  soft-tissue]
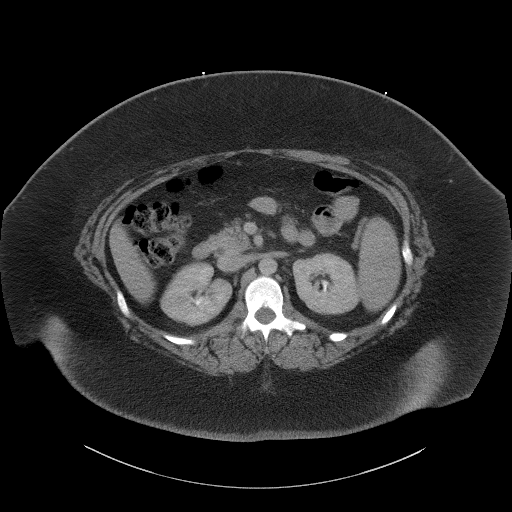
[im 76/106  soft-tissue]
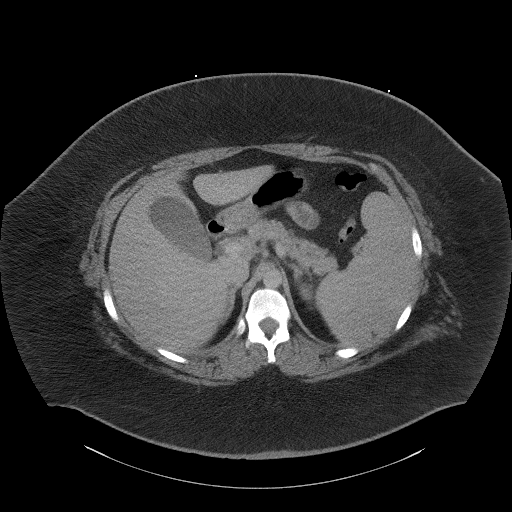
[im 76/106  bone]
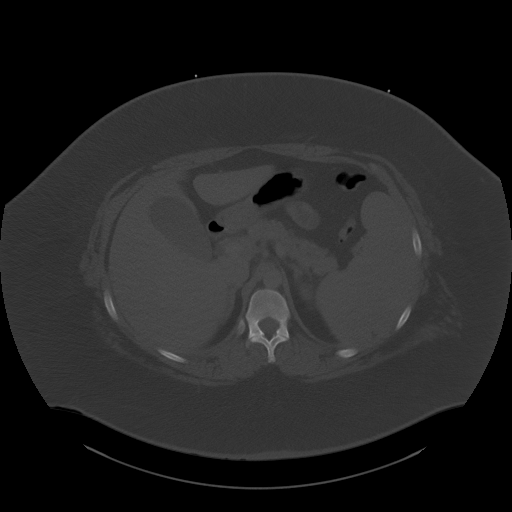
[im 85/106  soft-tissue]
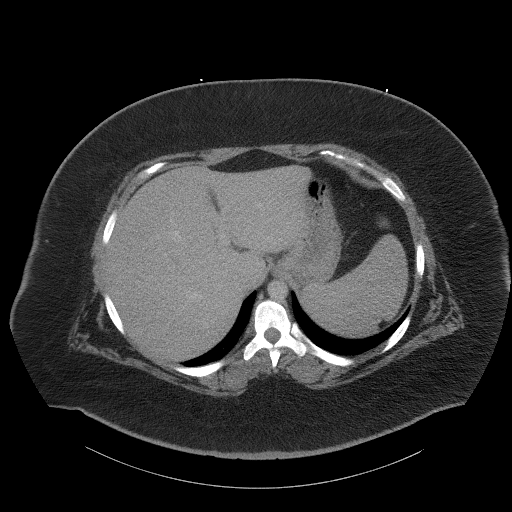
[im 93/106  soft-tissue]
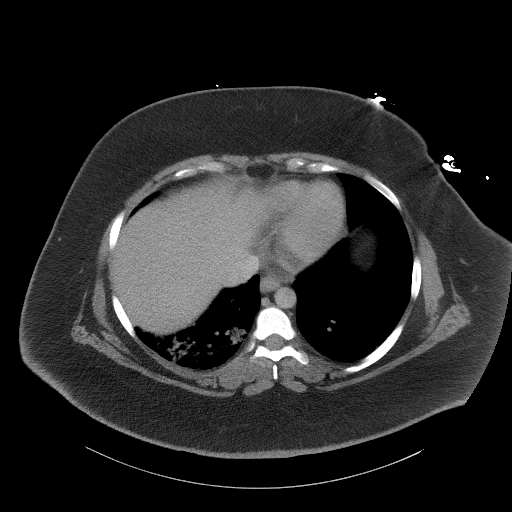
[im 101/106  soft-tissue]
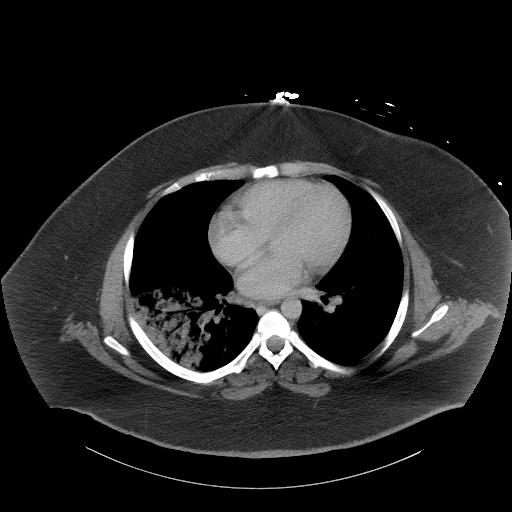

[Series 16: a/p w/ cor · coronal · 1.03mm/px · 3 of 151 slices shown]
[im 51/151  soft-tissue]
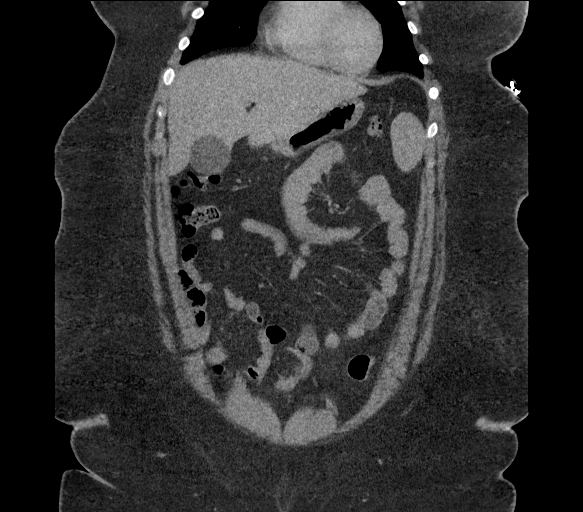
[im 67/151  soft-tissue]
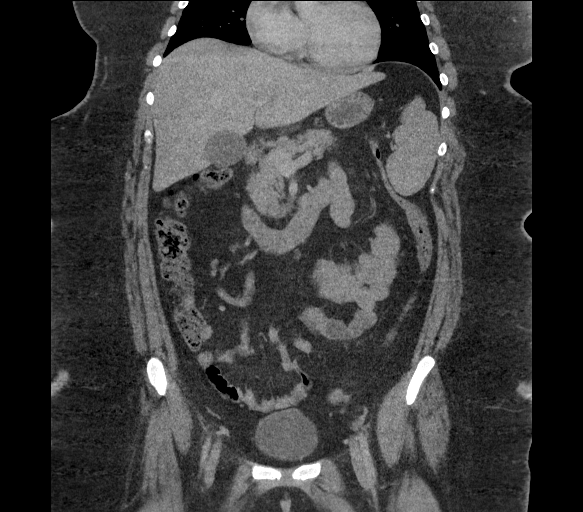
[im 84/151  soft-tissue]
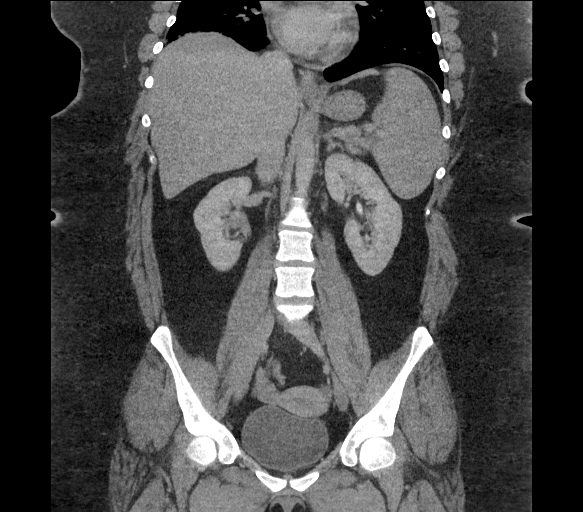

[15 of 46 positions shown; findings below may reference images not displayed]

FINDINGS: CTA CHEST FINDINGS

Cardiovascular: Technically limited evaluation for pulmonary embolus
given contrast bolus timing and soft tissue attenuation from
habitus. There is no central pulmonary embolus to the proximal
segmental level. A pulmonary arteries in the region of airspace
disease in the right lower lobe are mildly attenuated but there is
no discrete intraluminal filling defect. Normal caliber thoracic
aorta without dissection. Conventional branching pattern from the
aortic arch. Normal heart size without pericardial effusion.

Mediastinum/Nodes: Few prominent right hilar lymph nodes are likely
reactive. No enlarged mediastinal nodes. No left hilar adenopathy.
Tiny hiatal hernia. No visualized thyroid nodule.

Lungs/Pleura: Ground-glass and consolidative airspace opacities
throughout the right lower lobe compatible with pneumonia. Minimal
ill-defined ground-glass opacity in the left lower lobe likely
additional sites of infection. No pleural effusion. The trachea and
central bronchi are patent.

Musculoskeletal: There are no acute or suspicious osseous
abnormalities.

Review of the MIP images confirms the above findings.

CT ABDOMEN and PELVIS FINDINGS

Hepatobiliary: No focal liver abnormality is seen. No gallstones,
gallbladder wall thickening, or biliary dilatation.

Pancreas: No ductal dilatation or inflammation.

Spleen: Enlarged spanning 15.3 cm AP. Small cleft posteriorly. No
focal splenic lesion.

Adrenals/Urinary Tract: Normal adrenal glands. No hydronephrosis.
There is excreted IV contrast in the renal collecting systems which
limits evaluation for renal stone. No significant perinephric edema.
Urinary bladder is unremarkable.

Stomach/Bowel: Tiny hiatal hernia. Stomach otherwise unremarkable.
Normal small bowel. Normal terminal ileum. Normal appendix. Small
volume of colonic stool. No colonic inflammation.

Vascular/Lymphatic: No significant vascular findings are present. No
enlarged abdominal or pelvic lymph nodes.

Reproductive: Uterus and bilateral adnexa are unremarkable.

Other: No free air, free fluid, or intra-abdominal fluid collection.
Tiny fat containing umbilical hernia.

Musculoskeletal: There are no acute or suspicious osseous
abnormalities.

Review of the MIP images confirms the above findings.
IMPRESSION: 1. Technically limited evaluation for pulmonary embolus given
contrast bolus timing and soft tissue attenuation from habitus. No
central pulmonary embolus to the proximal segmental level.
2. Right lower lobe pneumonia. Minimal ill-defined ground-glass
opacity in the left lower lobe likely additional pneumonitis.
3. No acute abnormality in the abdomen/pelvis.
4. Splenomegaly.
5. Tiny hiatal hernia.

## 2022-03-17 ENCOUNTER — Encounter (HOSPITAL_BASED_OUTPATIENT_CLINIC_OR_DEPARTMENT_OTHER): Payer: Self-pay | Admitting: Emergency Medicine

## 2022-03-17 ENCOUNTER — Emergency Department (HOSPITAL_BASED_OUTPATIENT_CLINIC_OR_DEPARTMENT_OTHER)
Admission: EM | Admit: 2022-03-17 | Discharge: 2022-03-17 | Disposition: A | Discharge status: Home / Self Care | Payer: Self-pay | Attending: Emergency Medicine | Admitting: Emergency Medicine

## 2022-03-17 ENCOUNTER — Other Ambulatory Visit: Payer: Self-pay

## 2022-03-17 ENCOUNTER — Emergency Department (HOSPITAL_BASED_OUTPATIENT_CLINIC_OR_DEPARTMENT_OTHER): Payer: Self-pay

## 2022-03-17 DIAGNOSIS — Z79899 Other long term (current) drug therapy: Secondary | ICD-10-CM | POA: Insufficient documentation

## 2022-03-17 DIAGNOSIS — R109 Unspecified abdominal pain: Secondary | ICD-10-CM

## 2022-03-17 DIAGNOSIS — N390 Urinary tract infection, site not specified: Secondary | ICD-10-CM | POA: Insufficient documentation

## 2022-03-17 LAB — CBC WITH DIFFERENTIAL/PLATELET
Abs Immature Granulocytes: 0.05 10*3/uL (ref 0.00–0.07)
Basophils Absolute: 0 10*3/uL (ref 0.0–0.1)
Basophils Relative: 0 %
Eosinophils Absolute: 0.2 10*3/uL (ref 0.0–0.5)
Eosinophils Relative: 2 %
HCT: 38.3 % (ref 36.0–46.0)
Hemoglobin: 11.7 g/dL — ABNORMAL LOW (ref 12.0–15.0)
Immature Granulocytes: 0 %
Lymphocytes Relative: 15 %
Lymphs Abs: 1.7 10*3/uL (ref 0.7–4.0)
MCH: 21.4 pg — ABNORMAL LOW (ref 26.0–34.0)
MCHC: 30.5 g/dL (ref 30.0–36.0)
MCV: 70 fL — ABNORMAL LOW (ref 80.0–100.0)
Monocytes Absolute: 0.7 10*3/uL (ref 0.1–1.0)
Monocytes Relative: 6 %
Neutro Abs: 8.6 10*3/uL — ABNORMAL HIGH (ref 1.7–7.7)
Neutrophils Relative %: 77 %
Platelets: 473 10*3/uL — ABNORMAL HIGH (ref 150–400)
RBC: 5.47 MIL/uL — ABNORMAL HIGH (ref 3.87–5.11)
RDW: 17.7 % — ABNORMAL HIGH (ref 11.5–15.5)
WBC: 11.3 10*3/uL — ABNORMAL HIGH (ref 4.0–10.5)
nRBC: 0 % (ref 0.0–0.2)

## 2022-03-17 LAB — COMPREHENSIVE METABOLIC PANEL
ALT: 16 U/L (ref 0–44)
AST: 16 U/L (ref 15–41)
Albumin: 4.5 g/dL (ref 3.5–5.0)
Alkaline Phosphatase: 68 U/L (ref 38–126)
Anion gap: 10 (ref 5–15)
BUN: 8 mg/dL (ref 6–20)
CO2: 26 mmol/L (ref 22–32)
Calcium: 9.6 mg/dL (ref 8.9–10.3)
Chloride: 101 mmol/L (ref 98–111)
Creatinine, Ser: 0.65 mg/dL (ref 0.44–1.00)
GFR, Estimated: >60 mL/min (ref >60)
Glucose, Bld: 92 mg/dL (ref 70–99)
Potassium: 3.8 mmol/L (ref 3.5–5.1)
Sodium: 137 mmol/L (ref 135–145)
Total Bilirubin: 0.6 mg/dL (ref 0.3–1.2)
Total Protein: 7.6 g/dL (ref 6.5–8.1)

## 2022-03-17 LAB — URINALYSIS, ROUTINE W REFLEX MICROSCOPIC
Bilirubin Urine: NEGATIVE
Glucose, UA: NEGATIVE mg/dL
Ketones, ur: 15 mg/dL — AB
Nitrite: NEGATIVE
RBC / HPF: >50 RBC/hpf — ABNORMAL HIGH (ref 0–5)
Specific Gravity, Urine: 1.019 (ref 1.005–1.030)
pH: 6.5 (ref 5.0–8.0)

## 2022-03-17 LAB — HCG, SERUM, QUALITATIVE: Preg, Serum: NEGATIVE

## 2022-03-17 LAB — LIPASE, BLOOD: Lipase: 14 U/L (ref 11–51)

## 2022-03-17 MED ORDER — CEPHALEXIN 500 MG PO CAPS: 500.0000 mg | ORAL_CAPSULE | Freq: Two times a day (BID) | ORAL | 0 refills | Status: AC

## 2022-03-17 NOTE — ED Provider Notes (Signed)
[facility] EMERGENCY DEPT Provider Note   CSN: [id number] Arrival date & time: 03/17/22  1142     History  Chief Complaint  Patient presents with   Flank Pain    Carolyn [name] is a 21 y.o. female.  21 year old female with past medical history of kidney stone presents with complaint of pain in her left head back area for the past few days associated with nausea and vomiting.  States that she had a virus about 2-1/2 weeks ago, now with this left flank pain.  Denies dysuria, hematuria, frequency.  Reports fevers and chills with max temp of 102, arrives today afebrile, has not taken anything for fever for the past 3 days.  No other complaints or concerns.       Home Medications Prior to Admission medications   Medication Sig Start Date End Date Taking? Authorizing Provider  cephALEXin (KEFLEX) 500 MG capsule Take 1 capsule (500 mg total) by mouth 2 (two) times daily for 5 days. 03/17/22 03/22/22 Yes Jeannie Fend, PA-C  albuterol (VENTOLIN HFA) 108 (90 Base) MCG/ACT inhaler Inhale 1-2 puffs into the lungs every 6 (six) hours as needed for wheezing or shortness of breath. 06/05/21   Wallis Bamberg, PA-C  benzonatate (TESSALON) 100 MG capsule Take 1-2 capsules (100-200 mg total) by mouth 3 (three) times daily as needed for cough. 06/05/21   Wallis Bamberg, PA-C  cetirizine (ZYRTEC ALLERGY) 10 MG tablet Take 1 tablet (10 mg total) by mouth daily. 06/05/21   Wallis Bamberg, PA-C  hydrOXYzine (ATARAX/VISTARIL) 10 MG tablet Take 10 mg by mouth at bedtime as needed.    [provider]  ibuprofen (ADVIL) 400 MG tablet Take 1 tablet (400 mg total) by mouth every 6 (six) hours as needed for fever or mild pain (Use if tylenol ineffective). 04/04/20   Cipriano Bunker, MD  medroxyPROGESTERone (PROVERA) 5 MG tablet Take 2 tablets (10 mg total) by mouth daily. 04/10/21   Geoffery Lyons, MD  Norethindrone-Ethinyl Estradiol-Fe Biphas (LO LOESTRIN FE) 1 MG-10 MCG / 10 MCG tablet Take 1  tablet by mouth daily. Take 1 daily by mouth 03/29/21   Cyril Mourning A, NP  nystatin (MYCOSTATIN/NYSTOP) powder Apply 1 application topically 3 (three) times daily.    [provider]  promethazine-dextromethorphan (PROMETHAZINE-DM) 6.25-15 MG/5ML syrup Take 5 mLs by mouth at bedtime as needed for cough. 06/05/21   Wallis Bamberg, PA-C  pseudoephedrine (SUDAFED) 60 MG tablet Take 1 tablet (60 mg total) by mouth every 8 (eight) hours as needed for congestion. 06/05/21   Wallis Bamberg, PA-C      Allergies    Patient has no known allergies.    Review of Systems   Review of Systems Negative except as per HPI Physical Exam Updated Vital Signs BP (!) 152/89 (BP Location: Left Arm)   Pulse 86   Temp 98.4 F (36.9 C) (Oral)   Resp 16   Ht 5\' 2"  (1.575 m)   Wt (!) 167.8 kg   LMP 03/13/2022 (Approximate)   SpO2 97%   BMI 67.67 kg/m  Physical Exam Vitals and nursing note reviewed.  Constitutional:      General: She is not in acute distress.    Appearance: She is well-developed. She is not diaphoretic.  HENT:     Head: Normocephalic and atraumatic.  Cardiovascular:     Rate and Rhythm: Normal rate and regular rhythm.     Heart sounds: Normal heart sounds.  Pulmonary:     Effort: Pulmonary effort  is normal.     Breath sounds: Normal breath sounds.  Abdominal:     Palpations: Abdomen is soft.     Tenderness: There is no abdominal tenderness.  Musculoskeletal:     Thoracic back: Tenderness present. No bony tenderness.       Back:     Right lower leg: No edema.     Left lower leg: No edema.  Skin:    General: Skin is warm and dry.     Findings: No erythema or rash.  Neurological:     Mental Status: She is alert and oriented to person, place, and time.  Psychiatric:        Behavior: Behavior normal.     ED Results / Procedures / Treatments   Labs (all labs ordered are listed, but only abnormal results are displayed) Labs Reviewed  CBC WITH DIFFERENTIAL/PLATELET -  Abnormal; Notable for the following components:      Result Value   WBC 11.3 (*)    RBC 5.47 (*)    Hemoglobin 11.7 (*)    MCV 70.0 (*)    MCH 21.4 (*)    RDW 17.7 (*)    Platelets 473 (*)    Neutro Abs 8.6 (*)    All other components within normal limits  URINALYSIS, ROUTINE W REFLEX MICROSCOPIC - Abnormal; Notable for the following components:   APPearance HAZY (*)    Hgb urine dipstick LARGE (*)    Ketones, ur 15 (*)    Protein, ur TRACE (*)    Leukocytes,Ua TRACE (*)    RBC / HPF >50 (*)    Bacteria, UA RARE (*)    All other components within normal limits  COMPREHENSIVE METABOLIC PANEL  LIPASE, BLOOD  HCG, SERUM, QUALITATIVE    EKG None  Radiology CT Renal Stone Study  Result Date: 03/17/2022 CLINICAL DATA:  Several episodes of emesis over the past 2-3 weeks. New left flank pain and nausea. EXAM: CT ABDOMEN AND PELVIS WITHOUT CONTRAST TECHNIQUE: Multidetector CT imaging of the abdomen and pelvis was performed following the standard protocol without IV contrast. RADIATION DOSE REDUCTION: This exam was performed according to the departmental dose-optimization program which includes automated exposure control, adjustment of the mA and/or kV according to patient size and/or use of iterative reconstruction technique. COMPARISON:  08/12/2020 FINDINGS: Lower chest: Lung bases are normal. Hepatobiliary: Liver, gallbladder and biliary tree are within normal. Pancreas: Normal. Spleen: Normal. Adrenals/Urinary Tract: Adrenal glands are normal. Kidneys are normal in size without hydronephrosis or nephrolithiasis. Ureters and bladder are normal. Stomach/Bowel: Stomach and small bowel are normal. Appendix is normal. Colon is unremarkable. Vascular/Lymphatic: Abdominal aorta is normal in caliber. Remaining vascular structures are unremarkable. No adenopathy. Reproductive: Normal. Other: No free fluid or focal inflammatory change. Musculoskeletal: No focal abnormality. IMPRESSION: No acute  findings in the abdomen/pelvis. Electronically Signed   By: Marin Olp M.D.   On: 03/17/2022 16:05    Procedures Procedures    Medications Ordered in ED Medications - No data to display  ED Course/ Medical Decision Making/ A&P                           Medical Decision Making Amount and/or Complexity of Data Reviewed Labs: ordered. Radiology: ordered.  Risk Prescription drug management.   This patient presents to the ED for concern of left flank pain, vomiting and nausea, fever at home, this involves an extensive number of treatment options, and is a  complaint that carries with it a high risk of complications and morbidity.  The differential diagnosis includes but not limited to kidney stone, pyelonephritis, urinary tract infection, viral illness   Co morbidities that complicate the patient evaluation  Prior stone with infection, asthma, depression   Additional history obtained:  External records from outside source obtained and reviewed including discharge summary from 04/04/2020 where patient was admitted with left flank pain, secondary to 4 mm distal ureteral stone requiring IV antibiotics and admission as well as stent placement   Lab Tests:  I Ordered, and personally interpreted labs.  The pertinent results include: Lipase normal, CBC with mild leukocytosis white count 11.3, slight increase in neutrophils.  CMP within normal limits.  hCG negative.  Urinalysis positive for large hemoglobin, ketones, protein and trace leukocytes with greater than 50 red cells and rare bacteria.   Imaging Studies ordered:  I ordered imaging studies including CT abdomen pelvis without contrast as a stone study I independently visualized and interpreted imaging which showed no acute findings I agree with the radiologist interpretation   Problem List / ED Course / Critical interventions / Medication management  21 year old female with past medical history of kidney stone causing  hospital admission presents with complaint of left flank pain with nausea, vomiting, concern for fever at home although afebrile here.  On exam, pain is reproduced with palpation of her left lumbar paraspinous area, no specific CVA tenderness.  Abdomen is soft and nontender.  Question musculoskeletal pain versus pyelo-/UTI/stone.  Lab work revealed a mild leukocytosis, finally obtain a urinalysis which showed blood in the urine with possible infection.  For this reason and based on patient's history, a CT stone study was added on which is unremarkable.  Plan is to treat with antibiotics for likely urinary tract infection.  Recommend recheck with PCP this week with return precautions provided. I have reviewed the patients home medicines and have made adjustments as needed   Social Determinants of Health:  No PCP on file   Test / Admission - Considered:  After thorough work-up in the emergency room, patient is felt stable for discharge home at this time with plan to recheck with her primary care provider and return as needed.         Final Clinical Impression(s) / ED Diagnoses Final diagnoses:  Acute flank pain  Urinary tract infection in female    Rx / DC Orders ED Discharge Orders          Ordered    cephALEXin (KEFLEX) 500 MG capsule  2 times daily        03/17/22 1630              Jeannie Fend, PA-C 03/17/22 1635    Loetta Rough, MD 03/18/22 2111

## 2022-03-17 NOTE — ED Triage Notes (Signed)
Pt from home and c/o of several episodes of emesis over the past 2 and 1/2 weeks. Pt now reports a new pain to her the left flank and currently nauseas.

## 2022-03-17 NOTE — Discharge Instructions (Signed)
Take antibiotics as prescribed and complete the full course.  Recheck with your primary care provider this week.  Return to ER for severe or concerning symptoms.

## 2022-04-07 ENCOUNTER — Emergency Department (HOSPITAL_COMMUNITY)
Admission: EM | Admit: 2022-04-07 | Discharge: 2022-04-08 | Discharge status: Against Medical Advice | Payer: Self-pay | Attending: Student | Admitting: Student

## 2022-04-07 ENCOUNTER — Other Ambulatory Visit: Payer: Self-pay

## 2022-04-07 ENCOUNTER — Encounter (HOSPITAL_COMMUNITY): Payer: Self-pay

## 2022-04-07 DIAGNOSIS — Z5321 Procedure and treatment not carried out due to patient leaving prior to being seen by health care provider: Secondary | ICD-10-CM | POA: Insufficient documentation

## 2022-04-07 DIAGNOSIS — N3090 Cystitis, unspecified without hematuria: Secondary | ICD-10-CM | POA: Insufficient documentation

## 2022-04-07 DIAGNOSIS — R208 Other disturbances of skin sensation: Secondary | ICD-10-CM | POA: Insufficient documentation

## 2022-04-07 DIAGNOSIS — R197 Diarrhea, unspecified: Secondary | ICD-10-CM | POA: Insufficient documentation

## 2022-04-07 DIAGNOSIS — R112 Nausea with vomiting, unspecified: Secondary | ICD-10-CM | POA: Insufficient documentation

## 2022-04-07 LAB — COMPREHENSIVE METABOLIC PANEL
ALT: 24 U/L (ref 0–44)
AST: 19 U/L (ref 15–41)
Albumin: 4.2 g/dL (ref 3.5–5.0)
Alkaline Phosphatase: 74 U/L (ref 38–126)
Anion gap: 11 (ref 5–15)
BUN: 8 mg/dL (ref 6–20)
CO2: 26 mmol/L (ref 22–32)
Calcium: 9.4 mg/dL (ref 8.9–10.3)
Chloride: 104 mmol/L (ref 98–111)
Creatinine, Ser: 0.81 mg/dL (ref 0.44–1.00)
GFR, Estimated: >60 mL/min (ref >60)
Glucose, Bld: 99 mg/dL (ref 70–99)
Potassium: 3.8 mmol/L (ref 3.5–5.1)
Sodium: 141 mmol/L (ref 135–145)
Total Bilirubin: 0.5 mg/dL (ref 0.3–1.2)
Total Protein: 7.5 g/dL (ref 6.5–8.1)

## 2022-04-07 LAB — URINALYSIS, ROUTINE W REFLEX MICROSCOPIC
Bilirubin Urine: NEGATIVE
Glucose, UA: NEGATIVE mg/dL
Ketones, ur: NEGATIVE mg/dL
Nitrite: NEGATIVE
Protein, ur: 100 mg/dL — AB
RBC / HPF: >50 RBC/hpf — ABNORMAL HIGH (ref 0–5)
Specific Gravity, Urine: 1.024 (ref 1.005–1.030)
pH: 5 (ref 5.0–8.0)

## 2022-04-07 LAB — CBC
HCT: 41.1 % (ref 36.0–46.0)
Hemoglobin: 12.4 g/dL (ref 12.0–15.0)
MCH: 22.2 pg — ABNORMAL LOW (ref 26.0–34.0)
MCHC: 30.2 g/dL (ref 30.0–36.0)
MCV: 73.5 fL — ABNORMAL LOW (ref 80.0–100.0)
Platelets: 524 10*3/uL — ABNORMAL HIGH (ref 150–400)
RBC: 5.59 MIL/uL — ABNORMAL HIGH (ref 3.87–5.11)
RDW: 18.2 % — ABNORMAL HIGH (ref 11.5–15.5)
WBC: 14 10*3/uL — ABNORMAL HIGH (ref 4.0–10.5)
nRBC: 0 % (ref 0.0–0.2)

## 2022-04-07 LAB — I-STAT BETA HCG BLOOD, ED (MC, WL, AP ONLY): I-stat hCG, quantitative: <5 m[IU]/mL (ref <5)

## 2022-04-07 LAB — LIPASE, BLOOD: Lipase: 30 U/L (ref 11–51)

## 2022-04-07 NOTE — ED Triage Notes (Signed)
Reports nausea, vomiting, and diarrhea x 2 days.   Says she had a recent bladder infection and didn't finish abx tx. Began having new burning sensation today.

## 2022-04-07 NOTE — ED Provider Triage Note (Signed)
Emergency Medicine Provider Triage Evaluation Note  Carolyn [name] , a 21 y.o. female  was evaluated in triage.  Pt complains of 2 days of nausea vomiting and diarrhea.  Today she thinks that there was blood in one of her episodes of emesis.  Was treated for a bladder infection a couple weeks ago but did not finish the antibiotics.  Review of Systems  Positive:  Negative:   Physical Exam  BP (!) 143/103   Pulse 83   Temp 98.2 F (36.8 C)   Resp 18   Ht 5\' 2"  (1.575 m)   Wt (!) 167.8 kg   LMP 03/13/2022 (Approximate)   SpO2 98%   BMI 67.67 kg/m  Gen:   Awake, no distress   Resp:  Normal effort  MSK:   Moves extremities without difficulty  Other:  Generalized abdominal tenderness  Medical Decision Making  Medically screening exam initiated at 8:46 PM.  Appropriate orders placed.  Hindy LETZY GULLICKSON was informed that the remainder of the evaluation will be completed by another provider, this initial triage assessment does not replace that evaluation, and the importance of remaining in the ED until their evaluation is complete.    Reports a history of sepsis.  She was encouraged that her heart rate is fine, she is afebrile and not tachypneic.  Does not appear to be septic at this time.   Rhae Hammock, [place] 04/07/22 2055

## 2022-04-08 NOTE — ED Notes (Signed)
Pt stated she can't wait any longer and is leaving and be back in the morning. Pt was informed that if she leave and come back she would have to start the entire process over. She smiled and said ok.

## 2022-10-11 ENCOUNTER — Inpatient Hospital Stay (HOSPITAL_COMMUNITY)
Admission: AD | Admit: 2022-10-11 | Discharge: 2022-10-11 | Disposition: A | Discharge status: Home / Self Care | Payer: Self-pay | Attending: Obstetrics and Gynecology | Admitting: Obstetrics and Gynecology

## 2022-10-11 DIAGNOSIS — N912 Amenorrhea, unspecified: Secondary | ICD-10-CM | POA: Insufficient documentation

## 2022-10-11 LAB — POCT PREGNANCY, URINE
Preg Test, Ur: NEGATIVE
Preg Test, Ur: NEGATIVE

## 2022-10-11 LAB — HCG, QUANTITATIVE, PREGNANCY: hCG, Beta Chain, Quant, S: 1 m[IU]/mL (ref <5)

## 2022-10-11 NOTE — MAU Note (Signed)
.  Carolyn [name] is a 22 y.o. at Unknown here in MAU reporting: had a positive HPt yesterday. Had some "gooey red" spotting today when wiping and reports feeling cramping x 2 today .  LMP: 08/06/22 Onset of complaint: today Pain score: 0 Vitals:   10/11/22 1437  BP: 113/71  Pulse: 69  Resp: 18  Temp: 98.5 F (36.9 C)     FHT:n/a Lab orders placed from triage:  UPT (negative)

## 2022-10-11 NOTE — MAU Provider Note (Signed)
S Ms. Carolyn [name] is a 22 y.o. G0P0000 female who presents to MAU today with complaint of missed period and positive HPT. yesterday.   ROS: + spotting + cramping  O BP 113/71   Pulse 69   Temp 98.5 F (36.9 C)   Resp 18   Ht 5\' 2"  (1.575 m)   Wt (!) 143.8 kg   LMP 08/06/2022 (Within Days)   BMI 57.98 kg/m  Physical Exam Vitals and nursing note reviewed.  Constitutional:      General: She is not in acute distress.    Appearance: Normal appearance.  HENT:     Head: Normocephalic and atraumatic.  Cardiovascular:     Rate and Rhythm: Normal rate.  Pulmonary:     Effort: Pulmonary effort is normal. No respiratory distress.  Musculoskeletal:        General: Normal range of motion.     Cervical back: Normal range of motion.  Neurological:     General: No focal deficit present.     Mental Status: She is alert and oriented to person, place, and time.  Psychiatric:        Mood and Affect: Mood normal.        Behavior: Behavior normal.    Results for orders placed or performed during the hospital encounter of 10/11/22 (from the past 24 hour(s))  Pregnancy, urine POC     Status: None   Collection Time: 10/11/22  2:32 PM  Result Value Ref Range   Preg Test, Ur NEGATIVE NEGATIVE  hCG, quantitative, pregnancy     Status: None   Collection Time: 10/11/22  2:45 PM  Result Value Ref Range   hCG, Beta Chain, Quant, S 1 <5 mIU/mL  Pregnancy, urine POC     Status: None   Collection Time: 10/11/22  4:10 PM  Result Value Ref Range   Preg Test, Ur NEGATIVE NEGATIVE   MDM: UPT negative, qhcg ordered. Will call pt with results per her request, she is aware she may have to return for further evaluation. 1620: Pt informed of negative qhcg. Spotting and cramping is likely the start of menstrual cycle.  A 1. Amenorrhea    P Discharge from MAU in stable condition Patient may return to MAU as needed for pregnancy related complaints  Donette Larry, CNM 10/11/2022 4:24 PM

## 2023-02-25 ENCOUNTER — Other Ambulatory Visit: Payer: Self-pay

## 2023-02-25 ENCOUNTER — Emergency Department (HOSPITAL_BASED_OUTPATIENT_CLINIC_OR_DEPARTMENT_OTHER): Payer: Self-pay | Admitting: Radiology

## 2023-02-25 ENCOUNTER — Emergency Department (HOSPITAL_BASED_OUTPATIENT_CLINIC_OR_DEPARTMENT_OTHER)
Admission: EM | Admit: 2023-02-25 | Discharge: 2023-02-25 | Disposition: A | Discharge status: Home / Self Care | Payer: Self-pay | Attending: Emergency Medicine | Admitting: Emergency Medicine

## 2023-02-25 ENCOUNTER — Encounter (HOSPITAL_BASED_OUTPATIENT_CLINIC_OR_DEPARTMENT_OTHER): Payer: Self-pay | Admitting: Emergency Medicine

## 2023-02-25 DIAGNOSIS — M25562 Pain in left knee: Secondary | ICD-10-CM | POA: Insufficient documentation

## 2023-02-25 DIAGNOSIS — W108XXA Fall (on) (from) other stairs and steps, initial encounter: Secondary | ICD-10-CM | POA: Insufficient documentation

## 2023-02-25 DIAGNOSIS — W19XXXA Unspecified fall, initial encounter: Secondary | ICD-10-CM

## 2023-02-25 MED ORDER — CELECOXIB 200 MG PO CAPS: 200.0000 mg | ORAL_CAPSULE | Freq: Two times a day (BID) | ORAL | 0 refills | Status: DC

## 2023-02-25 MED ORDER — ACETAMINOPHEN 500 MG PO TABS
1000.0000 mg | ORAL_TABLET | Freq: Once | ORAL | Status: AC
  Administered 2023-02-25: 1000 mg via ORAL
  Filled 2023-02-25: qty 2

## 2023-02-25 MED ORDER — CELECOXIB 200 MG PO CAPS
200.0000 mg | ORAL_CAPSULE | Freq: Once | ORAL | Status: AC
  Administered 2023-02-25: 200 mg via ORAL
  Filled 2023-02-25: qty 1
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
## 2023-02-25 NOTE — ED Triage Notes (Signed)
Pt via pov from home with left knee pain since a fall on stairs last night. Pt states she is unable to bear weight on it without it buckling. Denies LOC. Pt alert & oriented, nad noted.

## 2023-02-25 NOTE — ED Notes (Signed)
Reviewed AVS/discharge instruction with patient. Time allotted for and all questions answered. Patient is agreeable for d/c and escorted to ed exit by staff.  

## 2023-04-08 ENCOUNTER — Inpatient Hospital Stay (HOSPITAL_COMMUNITY)
Admission: AD | Admit: 2023-04-08 | Discharge: 2023-04-08 | Disposition: A | Discharge status: Home / Self Care | Payer: Self-pay | Attending: Obstetrics & Gynecology | Admitting: Obstetrics & Gynecology

## 2023-04-08 DIAGNOSIS — Z3202 Encounter for pregnancy test, result negative: Secondary | ICD-10-CM

## 2023-04-08 DIAGNOSIS — Z7251 High risk heterosexual behavior: Secondary | ICD-10-CM | POA: Insufficient documentation

## 2023-04-08 DIAGNOSIS — Z202 Contact with and (suspected) exposure to infections with a predominantly sexual mode of transmission: Secondary | ICD-10-CM | POA: Insufficient documentation

## 2023-04-08 LAB — HCG, QUANTITATIVE, PREGNANCY: hCG, Beta Chain, Quant, S: <1 m[IU]/mL (ref <5)

## 2023-04-08 LAB — POCT PREGNANCY, URINE: Preg Test, Ur: NEGATIVE
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
## 2023-04-08 NOTE — MAU Provider Note (Signed)
Event Date/Time   First Provider Initiated Contact with Patient 04/08/23 2043      S Ms. Carolyn [name] is a 22 y.o. G0P0000 patient who presents to MAU today with complaint of nausea and spotting.  Patient reports she took a UPT this morning that was positive.  She states she noted some light spotting, in her underwear, and had some nausea.  She states last week she had UPSI. Patient requesting STD testing.   LMP was 03/11/2023.    O BP (!) 152/84 (BP Location: Right Arm)   Pulse 73   Temp 98.9 F (37.2 C) (Oral)   Resp 18   Ht 5\' 3"  (1.6 m)   Wt (!) 140.8 kg   SpO2 100%   BMI 55.00 kg/m  Physical Exam Vitals and nursing note reviewed.  Constitutional:      Appearance: Normal appearance.  HENT:     Head: Normocephalic and atraumatic.  Eyes:     Conjunctiva/sclera: Conjunctivae normal.  Pulmonary:     Effort: Pulmonary effort is normal. No respiratory distress.  Musculoskeletal:     Cervical back: Normal range of motion.  Neurological:     Mental Status: She is alert and oriented to person, place, and time.  Psychiatric:        Mood and Affect: Mood normal.        Behavior: Behavior normal.     A Negative UPT  Positive Home UPT UPSI  P -Informed that UPT negative today. -Discussed obtaining hCG today. -Reviewed not taking UPT until after a missed period. -Further educated on how UPSI was too recent and likely will return negative.  -Patient informed that she can go to primary gyn or [facility] for STD testing. -Further informed that she should follow up with primary care provider regarding elevated blood pressure.  -Informed that hCG level will be sent via mychart with relevant instructions.  -Discharge from MAU in stable condition -Warning signs for worsening condition that would warrant emergency follow-up discussed   Gerrit Heck, CNM 04/08/2023 8:43 PM

## 2023-05-13 ENCOUNTER — Emergency Department (HOSPITAL_BASED_OUTPATIENT_CLINIC_OR_DEPARTMENT_OTHER)
Admission: EM | Admit: 2023-05-13 | Discharge: 2023-05-13 | Disposition: A | Discharge status: Home / Self Care | Payer: Self-pay | Attending: Emergency Medicine | Admitting: Emergency Medicine

## 2023-05-13 ENCOUNTER — Other Ambulatory Visit: Payer: Self-pay

## 2023-05-13 ENCOUNTER — Emergency Department (HOSPITAL_BASED_OUTPATIENT_CLINIC_OR_DEPARTMENT_OTHER): Payer: Self-pay | Admitting: Radiology

## 2023-05-13 ENCOUNTER — Other Ambulatory Visit (HOSPITAL_BASED_OUTPATIENT_CLINIC_OR_DEPARTMENT_OTHER): Payer: Self-pay

## 2023-05-13 DIAGNOSIS — M5432 Sciatica, left side: Secondary | ICD-10-CM | POA: Insufficient documentation

## 2023-05-13 DIAGNOSIS — J45909 Unspecified asthma, uncomplicated: Secondary | ICD-10-CM | POA: Insufficient documentation

## 2023-05-13 DIAGNOSIS — M5442 Lumbago with sciatica, left side: Secondary | ICD-10-CM | POA: Diagnosis not present

## 2023-05-13 LAB — PREGNANCY, URINE: Preg Test, Ur: NEGATIVE

## 2023-05-13 MED ORDER — GABAPENTIN 100 MG PO CAPS
ORAL_CAPSULE | ORAL | 0 refills | Status: DC
  Filled 2023-05-13: qty 23, 9d supply, fill #0

## 2023-05-13 MED ORDER — LIDOCAINE 5 % EX PTCH
1.0000 | MEDICATED_PATCH | CUTANEOUS | Status: DC
  Administered 2023-05-13: 1 via TRANSDERMAL
  Filled 2023-05-13: qty 1

## 2023-05-13 MED ORDER — KETOROLAC TROMETHAMINE 15 MG/ML IJ SOLN
15.0000 mg | Freq: Once | INTRAMUSCULAR | Status: AC
  Administered 2023-05-13: 15 mg via INTRAMUSCULAR
  Filled 2023-05-13: qty 1

## 2023-05-13 MED ORDER — CYCLOBENZAPRINE HCL 10 MG PO TABS
10.0000 mg | ORAL_TABLET | Freq: Every day | ORAL | 0 refills | Status: AC
  Filled 2023-05-13: qty 7, 7d supply, fill #0

## 2023-05-13 NOTE — Discharge Instructions (Signed)
As discussed, imaging is reassuring.  Alternate between ibuprofen and Tylenol every 4 hours.  Will taper Gabapentin for the next week - instructions will be on the bottle.  This medication may cause drowsiness.  Additionally, I sent a prescription of Flexeril to the pharmacy.  Take this once a day at bedtime.  This can also cause drowsiness do not drive or operate heavy machinery while taking this medication.  I provided information for primary care and orthopedics you can follow-up with.  Return to the ED if her symptoms worsen in the interim.

## 2023-05-13 NOTE — ED Triage Notes (Signed)
Fall 9/18 resulting in left knee pain- dislocated-no fracture. Improved shortly with brace. Recently the knee has become stiff again resulting in left hip pain. Burning hip feeling-slips at times. Difficult to stand on x1 week.

## 2023-05-13 NOTE — ED Notes (Signed)
Dc instructions reviewed with patient. Patient voiced understanding. Dc with belongings.  °

## 2023-05-13 NOTE — ED Provider Notes (Signed)
[place] EMERGENCY DEPARTMENT AT [facility] Provider Note   CSN: [id number] Arrival date & time: 05/13/23  1328     History  Chief Complaint  Patient presents with   Hip Pain   Knee Pain    Carolyn [name] is a 22 y.o. female with a history of obesity, asthma, and anxiety presents the ED today for hip pain.  Patient reports she started having intermittent pain in her left hip last night.  This morning she woke up with severe pain in her left hip that radiates down to her lateral thigh.  She states it feels like a burning sensation inside of her leg.  She has never experienced anything of this before.  She is still able to ambulate.  She was seen in September after falling down stairs and injuring her left knee.  She states that her left knee was presumed to have dislocated then relocated on its own and that she may have a ligamentous injury.  She did not follow-up with orthopedics since she did not have insurance until today.  Patient states that she still has pain with straightening her knee but she has been able to walk on it despite pain.  Denies warmth to touch of the left hip or knee, swelling, or fevers.  She has not tried take anything for pain prior to arrival.  No additional complaints or concerns at this time.    Home Medications Prior to Admission medications   Medication Sig Start Date End Date Taking? Authorizing Provider  cyclobenzaprine (FLEXERIL) 10 MG tablet Take 1 tablet (10 mg total) by mouth at bedtime for 7 days. 05/13/23 05/20/23 Yes Maxwell Marion, PA-C  gabapentin (NEURONTIN) 100 MG capsule Take 1 capsule (100 mg total) by mouth 2 (two) times daily for 2 days, THEN 1 capsule (100 mg total) 3 (three) times daily for 5 days, THEN 1 capsule (100 mg total) 2 (two) times daily for 2 days. 05/13/23 05/22/23 Yes Maxwell Marion, PA-C  albuterol (VENTOLIN HFA) 108 (90 Base) MCG/ACT inhaler Inhale 1-2 puffs into the lungs every 6 (six) hours as needed for wheezing or  shortness of breath. 06/05/21   Wallis Bamberg, PA-C  cetirizine (ZYRTEC ALLERGY) 10 MG tablet Take 1 tablet (10 mg total) by mouth daily. 06/05/21   Wallis Bamberg, PA-C      Allergies    Patient has no known allergies.    Review of Systems   Review of Systems  Musculoskeletal:  Positive for arthralgias.  All other systems reviewed and are negative.   Physical Exam Updated Vital Signs BP 112/77   Pulse 70   Temp 97.8 F (36.6 C) (Oral)   Resp 20   SpO2 100%  Physical Exam Vitals and nursing note reviewed.  Constitutional:      General: She is not in acute distress.    Appearance: Normal appearance.  HENT:     Head: Normocephalic and atraumatic.     Mouth/Throat:     Mouth: Mucous membranes are moist.  Eyes:     Conjunctiva/sclera: Conjunctivae normal.     Pupils: Pupils are equal, round, and reactive to light.  Cardiovascular:     Rate and Rhythm: Normal rate and regular rhythm.     Pulses: Normal pulses.     Heart sounds: Normal heart sounds.  Pulmonary:     Effort: Pulmonary effort is normal.     Breath sounds: Normal breath sounds.  Abdominal:     Palpations: Abdomen is soft.  Tenderness: There is no abdominal tenderness.  Musculoskeletal:        General: Tenderness present.     Right lower leg: No edema.     Left lower leg: No edema.     Comments: Tenderness to palpation of left posterior and lateral hip as well as the inferior aspect of left knee.  No warmth to touch, erythema, or swelling appreciated.  Strength and sensation remains intact bilaterally.  Palpable dorsalis pedis pulses.  Skin:    General: Skin is warm and dry.     Findings: No rash.  Neurological:     General: No focal deficit present.     Mental Status: She is alert.     Sensory: No sensory deficit.     Motor: No weakness.  Psychiatric:        Mood and Affect: Mood normal.        Behavior: Behavior normal.    ED Results / Procedures / Treatments   Labs (all labs ordered are listed,  but only abnormal results are displayed) Labs Reviewed  PREGNANCY, URINE    EKG None  Radiology DG Hip Unilat W or Wo Pelvis 2-3 Views Left  Result Date: 05/13/2023 CLINICAL DATA:  Hip and knee pain EXAM: DG HIP (WITH OR WITHOUT PELVIS) 2-3V LEFT COMPARISON:  08/12/2020 CT reconstructions FINDINGS: There is no evidence of hip fracture or dislocation. There is no evidence of arthropathy or other focal bone abnormality. IMPRESSION: No acute abnormality by plain radiography Electronically Signed   By: Judie Petit.  Shick M.D.   On: 05/13/2023 15:19   DG Knee Complete 4 Views Left  Result Date: 05/13/2023 CLINICAL DATA:  knee pain EXAM: LEFT KNEE - COMPLETE 4+ VIEW COMPARISON:  None Available. FINDINGS: No evidence of fracture, dislocation, or joint effusion. No evidence of arthropathy or other focal bone abnormality. Soft tissues are unremarkable. Obese body habitus. IMPRESSION: No acute abnormality by plain radiography Electronically Signed   By: Judie Petit.  Shick M.D.   On: 05/13/2023 15:18    Procedures Procedures: not indicated.   Medications Ordered in ED Medications  lidocaine (LIDODERM) 5 % 1 patch (1 patch Transdermal Patch Applied 05/13/23 1505)  ketorolac (TORADOL) 15 MG/ML injection 15 mg (15 mg Intramuscular Given 05/13/23 1505)    ED Course/ Medical Decision Making/ A&P                                 Medical Decision Making Amount and/or Complexity of Data Reviewed Labs: ordered. Radiology: ordered.  Risk Prescription drug management.   This patient presents to the ED for concern of left hip pain, this involves an extensive number of treatment options, and is a complaint that carries with it a high risk of complications and morbidity.   Differential diagnosis includes: fracture, dislocation, ligamentous injury, contusion, degenerative changes, radiculopathy, etc.   Comorbidities  See HPI above   Additional History  Additional history obtained from prior ED notes.   Lab  Tests  I ordered and personally interpreted labs.  The pertinent results include:   Negative urine pregnancy test   Imaging Studies  I ordered imaging studies including left hip with pelvis and left knee x-rays  I independently visualized and interpreted imaging which showed:  No evidence of fracture, dislocation, or joint effusion on hip or knee x-rays. No evidence of arthropathy or other focal bone abnormality. I agree with the radiologist interpretation   Problem List / ED  Course / Critical Interventions / Medication Management  Left hip pain radiating to the left lateral thigh I ordered medications including: Toradol and lidocaine patch for pain  Reevaluation of the patient after these medicines showed that the patient stayed the same I have reviewed the patients home medicines and have made adjustments as needed   Social Determinants of Health  Physical activity   Test / Admission - Considered  Discussed findings with patient.  All questions answered. She is hemodynamically stable and safe for discharge home. Return precautions provided.       Final Clinical Impression(s) / ED Diagnoses Final diagnoses:  Sciatica of left side    Rx / DC Orders ED Discharge Orders          Ordered    gabapentin (NEURONTIN) 100 MG capsule  Multiple Frequencies        05/13/23 1616    cyclobenzaprine (FLEXERIL) 10 MG tablet  Daily at bedtime        05/13/23 1616              Maxwell Marion, PA-C 05/13/23 1948    Anders Simmonds T, DO 05/16/23 [phone]

## 2023-05-27 ENCOUNTER — Other Ambulatory Visit (HOSPITAL_COMMUNITY)
Admission: RE | Admit: 2023-05-27 | Discharge: 2023-05-27 | Disposition: A | Discharge status: Home / Self Care | Payer: Medicaid Other | Source: Ambulatory Visit | Attending: Adult Health | Admitting: Adult Health

## 2023-05-27 ENCOUNTER — Encounter: Payer: Self-pay | Admitting: Adult Health

## 2023-05-27 ENCOUNTER — Ambulatory Visit: Payer: Medicaid Other | Admitting: Adult Health

## 2023-05-27 VITALS — BP 139/87 | HR 73 | Ht 63.0 in | Wt 304.0 lb

## 2023-05-27 DIAGNOSIS — Z113 Encounter for screening for infections with a predominantly sexual mode of transmission: Secondary | ICD-10-CM | POA: Diagnosis not present

## 2023-05-27 DIAGNOSIS — L02224 Furuncle of groin: Secondary | ICD-10-CM

## 2023-05-27 DIAGNOSIS — Z124 Encounter for screening for malignant neoplasm of cervix: Secondary | ICD-10-CM | POA: Diagnosis not present

## 2023-05-27 DIAGNOSIS — L732 Hidradenitis suppurativa: Secondary | ICD-10-CM

## 2023-05-27 DIAGNOSIS — N926 Irregular menstruation, unspecified: Secondary | ICD-10-CM | POA: Diagnosis not present

## 2023-05-27 MED ORDER — SILVER SULFADIAZINE 1 % EX CREA
1.0000 | TOPICAL_CREAM | Freq: Two times a day (BID) | CUTANEOUS | 1 refills | Status: AC
Start: 2023-05-27 — End: ?

## 2023-05-27 MED ORDER — SULFAMETHOXAZOLE-TRIMETHOPRIM 800-160 MG PO TABS: 1.0000 | ORAL_TABLET | Freq: Two times a day (BID) | ORAL | 0 refills | Status: DC

## 2023-05-27 MED ORDER — METFORMIN HCL 500 MG PO TABS: 500.0000 mg | ORAL_TABLET | Freq: Two times a day (BID) | ORAL | 6 refills | Status: AC

## 2023-05-27 NOTE — Progress Notes (Signed)
  Subjective:     Patient ID: Judithe Modest, female   DOB: 2001/05/02, 22 y.o.   MRN: [id number]  HPI Phung is a 22 year old white female,single, G0P0 in complaining of ?cyst on mons pubis, she has  HS, under breasts and underarms. She requests STD testing and needs a pap.  Review of Systems ?cyst on mons pubis Periods irregular  Reviewed past medical,surgical, social and family history. Reviewed medications and allergies.     Objective:   Physical Exam BP 139/87 (BP Location: Right Arm, Patient Position: Sitting, Cuff Size: Large)   Pulse 73   Ht 5\' 3"  (1.6 m)   Wt (!) 304 lb (137.9 kg)   LMP 04/13/2023   BMI 53.85 kg/m     Skin warm and dry.Pelvic: external genitalia , has scarring inner thighs and mons pubis and has boil on mons pubis, vagina: pink,urethra has no lesions or masses noted, cervix:smooth, pap performed. CV swab obtained, uterus: normal size, shape and contour, non tender, no masses felt, adnexa: no masses or tenderness noted. Bladder is non tender and no masses felt.  Has scarring both under arms. Examination chaperoned by Freddie Apley RN  Fall risk is moderate  Upstream - 05/27/23 1109       Pregnancy Intention Screening   Does the patient want to become pregnant in the next year? Ok Either Way    Would the patient like to discuss contraceptive options today? No      Contraception Wrap Up   Current Method Female Condom    End Method Female Condom    Contraception Counseling Provided No             Assessment:     1. Hidradenitis suppurativa Will start on metformin to see if decreases some inflammation  2. Boil of groin (Primary) +boil on mons pubis Will rx septra ds 1 bid x 14 days  Will rx silvadene to use bid Meds ordered this encounter  Medications   sulfamethoxazole-trimethoprim (BACTRIM DS) 800-160 MG tablet    Sig: Take 1 tablet by mouth 2 (two) times daily. Take 1 bid    Dispense:  28 tablet    Refill:  0    Supervising Provider:    Duane Lope H [[id number]]   metFORMIN (GLUCOPHAGE) 500 MG tablet    Sig: Take 1 tablet (500 mg total) by mouth 2 (two) times daily with a meal.    Dispense:  60 tablet    Refill:  6    Supervising Provider:   Duane Lope H [[id number]]   silver sulfADIAZINE (SILVADENE) 1 % cream    Sig: Apply 1 Application topically 2 (two) times daily.    Dispense:  50 g    Refill:  1    Supervising Provider:   Duane Lope H [[id number]]   Do not squeeze  Use bar soaps   3. Routine Papanicolaou smear Pap sent Pap in 3 years if normal - Cytology - PAP( [place])  4. Screening examination for STD (sexually transmitted disease) CV swab sent for GC/CHL,trich.BV and yeast  Will check HIV,RPR and Hepatis C antibody - Cervicovaginal ancillary only( [place]) - HIV Antibody (routine testing w rflx) - RPR - Hepatitis C antibody  5. Irregular periods Keep period log     Plan:     Follow up in 4 weeks for recheck

## 2023-05-28 LAB — CERVICOVAGINAL ANCILLARY ONLY
Bacterial Vaginitis (gardnerella): NEGATIVE
Candida Glabrata: NEGATIVE
Candida Vaginitis: NEGATIVE
Chlamydia: NEGATIVE
Comment: NEGATIVE
Comment: NEGATIVE
Comment: NEGATIVE
Comment: NEGATIVE
Comment: NEGATIVE
Comment: NORMAL
Neisseria Gonorrhea: NEGATIVE
Trichomonas: NEGATIVE

## 2023-05-28 LAB — HIV ANTIBODY (ROUTINE TESTING W REFLEX): HIV Screen 4th Generation wRfx: NONREACTIVE

## 2023-05-28 LAB — HEPATITIS C ANTIBODY: Hep C Virus Ab: NONREACTIVE

## 2023-05-28 LAB — CYTOLOGY - PAP
Adequacy: ABSENT
Diagnosis: NEGATIVE

## 2023-05-28 LAB — RPR: RPR Ser Ql: NONREACTIVE

## 2023-06-24 ENCOUNTER — Ambulatory Visit: Payer: Medicaid Other | Admitting: Adult Health

## 2023-06-29 ENCOUNTER — Ambulatory Visit: Payer: Medicaid Other | Admitting: Adult Health

## 2023-07-21 ENCOUNTER — Ambulatory Visit: Payer: Medicaid Other | Admitting: Adult Health

## 2023-07-22 ENCOUNTER — Encounter: Payer: Self-pay | Admitting: Adult Health

## 2023-07-22 ENCOUNTER — Ambulatory Visit (INDEPENDENT_AMBULATORY_CARE_PROVIDER_SITE_OTHER): Payer: Medicaid Other | Admitting: Adult Health

## 2023-07-22 VITALS — BP 133/87 | HR 80 | Ht 63.0 in | Wt 307.5 lb

## 2023-07-22 DIAGNOSIS — N926 Irregular menstruation, unspecified: Secondary | ICD-10-CM

## 2023-07-22 DIAGNOSIS — L732 Hidradenitis suppurativa: Secondary | ICD-10-CM | POA: Diagnosis not present

## 2023-07-22 DIAGNOSIS — L02224 Furuncle of groin: Secondary | ICD-10-CM | POA: Diagnosis not present

## 2023-07-22 DIAGNOSIS — Z3202 Encounter for pregnancy test, result negative: Secondary | ICD-10-CM | POA: Diagnosis not present

## 2023-07-22 DIAGNOSIS — R350 Frequency of micturition: Secondary | ICD-10-CM

## 2023-07-22 LAB — POCT URINE PREGNANCY: Preg Test, Ur: NEGATIVE

## 2023-07-22 LAB — POCT URINALYSIS DIPSTICK
Glucose, UA: NEGATIVE
Ketones, UA: NEGATIVE
Nitrite, UA: NEGATIVE
Protein, UA: NEGATIVE

## 2023-07-22 MED ORDER — SULFAMETHOXAZOLE-TRIMETHOPRIM 800-160 MG PO TABS
1.0000 | ORAL_TABLET | Freq: Two times a day (BID) | ORAL | 0 refills | Status: AC
Start: 2023-07-22 — End: ?

## 2023-07-22 NOTE — Progress Notes (Signed)
  Subjective:     Patient ID: Carolyn [name], female   DOB: May 26, 2001, 23 y.o.   MRN: [id number]  HPI Carolyn [name] is a 23 year old white female,single, G0P0, in complaining of urinary frequency for 1 week and boil is better. Has noticed some vaginal dryness since starting metformin.  Has missed a period but has irregular periods.     Component Value Date/Time   DIAGPAP  05/27/2023 1134    - Negative for intraepithelial lesion or malignancy (NILM)   ADEQPAP  05/27/2023 1134    Satisfactory for evaluation; transformation zone component ABSENT.    Review of Systems Has urinary frequency for 1 week Boil is better Has missed a period, but has irregular periods  Has some vaginal dryness since starting metformin, can try replens Reviewed past medical,surgical, social and family history. Reviewed medications and allergies.     Objective:   Physical Exam BP 133/87 (BP Location: Right Arm, Patient Position: Sitting, Cuff Size: Normal)   Pulse 80   Ht 5\' 3"  (1.6 m)   Wt (!) 307 lb 8 oz (139.5 kg)   LMP 06/10/2023 (Approximate)   BMI 54.47 kg/m  UPT is negative. Urine dipstick: trace leuks, and blood. Skin warm and dry.Pelvic: external genitalia is normal in appearance, boil has resolved, has scarring from HS.    Fall risk is low  Up[facility] - 07/22/23 2130       Pregnancy Intention Screening   Does the patient want to become pregnant in the next year? No    Does the patient's partner want to become pregnant in the next year? No    Would the patient like to discuss contraceptive options today? No      Contraception Wrap Up   Current Method No Method - Other Reason    End Method Female Condom    Contraception Counseling Provided No             Assessment:     1. Urinary frequency (Primary) UA C&S sent  Will rx septra ds Meds ordered this encounter  Medications   sulfamethoxazole-trimethoprim (BACTRIM DS) 800-160 MG tablet    Sig: Take 1 tablet by mouth 2 (two) times daily. Take 1  bid    Dispense:  14 tablet    Refill:  0    Supervising Provider:   Duane Lope H [[id number]]    - POCT Urinalysis Dipstick - Urine Culture - Urinalysis, Routine w reflex microscopic  2. Pregnancy examination or test, negative result - POCT urine pregnancy  3. Hidradenitis suppurativa  4. Boil of groin, resolved  5. Missed period UPT is negative   6. Irregular periods Never miss over 3 months of periods     Plan:     Follow up in 3 months for ROS

## 2023-07-23 LAB — URINALYSIS, ROUTINE W REFLEX MICROSCOPIC
Bilirubin, UA: NEGATIVE
Glucose, UA: NEGATIVE
Ketones, UA: NEGATIVE
Leukocytes,UA: NEGATIVE
Nitrite, UA: NEGATIVE
Protein,UA: NEGATIVE
RBC, UA: NEGATIVE
Specific Gravity, UA: 1.017 (ref 1.005–1.030)
Urobilinogen, Ur: 0.2 mg/dL (ref 0.2–1.0)
pH, UA: 7.5 (ref 5.0–7.5)

## 2023-07-24 LAB — URINE CULTURE

## 2023-10-19 ENCOUNTER — Ambulatory Visit: Payer: Medicaid Other | Admitting: Adult Health

## 2023-12-30 ENCOUNTER — Encounter: Admitting: Radiology

## 2024-04-20 DIAGNOSIS — Z48817 Encounter for surgical aftercare following surgery on the skin and subcutaneous tissue: Secondary | ICD-10-CM | POA: Diagnosis not present
